# Patient Record
Sex: Female | Born: 1980 | Race: Black or African American | Hispanic: No | Marital: Single | Smoking: Former smoker
Health system: Southern US, Community
[De-identification: ages and names within clinical notes are randomized; demographics above are authoritative.]

## PROBLEM LIST (undated history)

## (undated) DIAGNOSIS — A64 Unspecified sexually transmitted disease: Secondary | ICD-10-CM

## (undated) DIAGNOSIS — K219 Gastro-esophageal reflux disease without esophagitis: Secondary | ICD-10-CM

## (undated) DIAGNOSIS — M1990 Unspecified osteoarthritis, unspecified site: Secondary | ICD-10-CM

## (undated) DIAGNOSIS — G43909 Migraine, unspecified, not intractable, without status migrainosus: Secondary | ICD-10-CM

## (undated) DIAGNOSIS — N920 Excessive and frequent menstruation with regular cycle: Secondary | ICD-10-CM

## (undated) DIAGNOSIS — J189 Pneumonia, unspecified organism: Secondary | ICD-10-CM

## (undated) DIAGNOSIS — F419 Anxiety disorder, unspecified: Secondary | ICD-10-CM

## (undated) DIAGNOSIS — I10 Essential (primary) hypertension: Secondary | ICD-10-CM

## (undated) DIAGNOSIS — F329 Major depressive disorder, single episode, unspecified: Secondary | ICD-10-CM

## (undated) DIAGNOSIS — Z789 Other specified health status: Secondary | ICD-10-CM

## (undated) DIAGNOSIS — T7840XA Allergy, unspecified, initial encounter: Secondary | ICD-10-CM

## (undated) DIAGNOSIS — D649 Anemia, unspecified: Secondary | ICD-10-CM

## (undated) DIAGNOSIS — T8859XA Other complications of anesthesia, initial encounter: Secondary | ICD-10-CM

## (undated) DIAGNOSIS — F32A Depression, unspecified: Secondary | ICD-10-CM

## (undated) DIAGNOSIS — E282 Polycystic ovarian syndrome: Secondary | ICD-10-CM

## (undated) HISTORY — PX: WISDOM TOOTH EXTRACTION: SHX21

## (undated) HISTORY — PX: LAPAROSCOPIC GASTRIC SLEEVE RESECTION: SHX5895

## (undated) HISTORY — DX: Unspecified sexually transmitted disease: A64

## (undated) HISTORY — PX: LUMBAR PUNCTURE: SHX1985

## (undated) HISTORY — DX: Allergy, unspecified, initial encounter: T78.40XA

## (undated) HISTORY — DX: Polycystic ovarian syndrome: E28.2

## (undated) HISTORY — PX: OTHER SURGICAL HISTORY: SHX169

## (undated) HISTORY — DX: Anemia, unspecified: D64.9

## (undated) HISTORY — DX: Migraine, unspecified, not intractable, without status migrainosus: G43.909

## (undated) HISTORY — PX: TONSILLECTOMY: SUR1361

---
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
## 2008-08-08 ENCOUNTER — Inpatient Hospital Stay (HOSPITAL_COMMUNITY): Admission: AD | Admit: 2008-08-08 | Discharge: 2008-08-08 | Payer: Self-pay | Admitting: Obstetrics & Gynecology

## 2008-10-09 ENCOUNTER — Inpatient Hospital Stay (HOSPITAL_COMMUNITY): Admission: AD | Admit: 2008-10-09 | Discharge: 2008-10-09 | Payer: Self-pay | Admitting: Obstetrics & Gynecology

## 2008-11-04 ENCOUNTER — Ambulatory Visit: Payer: Self-pay | Admitting: Obstetrics & Gynecology

## 2008-11-04 ENCOUNTER — Encounter: Payer: Self-pay | Admitting: Obstetrics & Gynecology

## 2008-11-04 LAB — CONVERTED CEMR LAB
TSH: 0.836 microintl units/mL (ref 0.350–4.50)
Testosterone: 44.98 ng/dL (ref 10–70)
hCG, Beta Chain, Quant, S: <2 milliintl units/mL

## 2008-11-15 ENCOUNTER — Ambulatory Visit (HOSPITAL_COMMUNITY): Admission: RE | Admit: 2008-11-15 | Discharge: 2008-11-15 | Payer: Self-pay | Admitting: Family Medicine

## 2008-11-25 ENCOUNTER — Ambulatory Visit: Payer: Self-pay | Admitting: Obstetrics & Gynecology

## 2011-05-01 NOTE — Group Therapy Note (Signed)
Jamie Chung, Jamie Chung              ACCOUNT NO.:  [id number]   MEDICAL RECORD NO.:  [id number]          PATIENT TYPE:  WOC   LOCATION:  [facility]                   FACILITY:  [facility]   PHYSICIAN:  Johnella Moloney, MD        DATE OF BIRTH:  Dec 30, 1980   DATE OF SERVICE:  11/04/2008                                  CLINIC NOTE   CHIEF COMPLAINT:  Irregular periods since 2 miscarriages; one in October  2008 and one in April 2009.   HISTORY OF PRESENT ILLNESS:  The patient states that she had a  miscarriage in October 2008 at approximately 10 weeks and a miscarriage  in April 2009 at 8 weeks at [facility].  Since that time, she has  had an irregular cycle, only menstruating in June and October of 2009.  Cycles during that time were normal in duration and flow.  Other months  she describes cramping during normal times, but no flow.  She also  describes some irregular vaginal discharge approximately 1-1/2 months  ago that cleared spontaneously, and again one week ago, which got better  with over the counter Monistat.  Since that time, she has only described  vaginal pain with intercourse.  No other pain, no burning or itching, no  urinary symptoms.  No other frank abdominal pain and no pain with bowel  movements.  She denies any abnormal Pap smears.   OB/GYN HISTORY:  This is a 30 year old female, gravida 2, para 0-0-2-0.  First day of the last menstrual cycle was October 10, 2008.  Menarche  began at age 89.  Irregular menstrual cycles, as stated above.  Cycles  usually last 5-6 days, medium flow, with mild pain with cycles with some  bleeding in between her cycles.   OBSTETRICAL HISTORY:  As stated above, gravida 2, para 0-0-2-0.  She  denies any cesarean sections.  Currently denies using any form of  contraception.  Date of last Pap smear is unknown.  Denies ever having  an abnormal Pap smear.   PAST MEDICAL HISTORY:  1. Seasonal allergies.  2. Asthma.   SOCIAL HISTORY:   Currently smokes 1/2 pack of cigarettes a day.  Denies  alcohol or drug abuse.   MEDICATIONS:  1. Claritin.  2. Tylenol.   ALLERGIES:  No known drug allergies.   PAST SURGICAL HISTORY:  None.   PHYSICAL EXAMINATION:  VITAL SIGNS:  Temperature 97.2, pulse 82 and  regular, blood pressure 117/75, weight 216 pounds, height 68-1/2 inches,  respirations 28 and regular.  GENERAL:  This is an obese African American female seated comfortably in  the room.  HEENT:  Head is normocephalic, atraumatic.  NECK:  Supple.  No masses.  No lymphadenopathy.  No thyromegaly.  BREASTS:  Breasts are bilateral and nontender.  No masses.  No  discharge.  No lymphadenopathy.  HEART:  S1, S2 distinct.  No murmurs, rubs, or gallops.  No S3 or S4.  LUNGS:  Good inspiratory effort.  Lungs are clear in all fields  bilaterally.  No wheezes, rhonchi, or crackles.  ABDOMEN:  Obese, soft, nontender.  No masses.  No  distention.  No  organomegaly.  PELVIC:  External genitalia are normal in appearance.  No lesions.  Normal hair distribution.  Internal genital exam is moist, pink, normal  rugae.  No lesions present.  Normal discharge from the os.  Pap was  performed today.   ASSESSMENT AND PLAN:  As stated above, this is a 30 year old female,  gravida 2, para 0-0-2-0.  Today we will check beta hCG, TSH, prolactin,  total testosterone, pelvic ultrasound.  Annual exam with Pap is pending.  The patient is to return to the clinic in 2 weeks for follow up and to  check results.           ______________________________  Johnella Moloney, MD     UD/MEDQ  D:  11/04/2008  T:  11/04/2008  Job:  [id number]

## 2014-12-08 ENCOUNTER — Inpatient Hospital Stay (HOSPITAL_COMMUNITY)
Admission: AD | Admit: 2014-12-08 | Discharge: 2014-12-08 | Disposition: A | Discharge status: Home / Self Care | Payer: 59 | Source: Ambulatory Visit | Attending: Obstetrics and Gynecology | Admitting: Obstetrics and Gynecology

## 2014-12-08 DIAGNOSIS — N939 Abnormal uterine and vaginal bleeding, unspecified: Secondary | ICD-10-CM | POA: Diagnosis present

## 2014-12-08 DIAGNOSIS — R109 Unspecified abdominal pain: Secondary | ICD-10-CM | POA: Diagnosis present

## 2014-12-08 DIAGNOSIS — N925 Other specified irregular menstruation: Secondary | ICD-10-CM | POA: Diagnosis not present

## 2014-12-08 LAB — CBC
HCT: 34.1 % — ABNORMAL LOW (ref 36.0–46.0)
HEMOGLOBIN: 11.6 g/dL — AB (ref 12.0–15.0)
MCH: 30.4 pg (ref 26.0–34.0)
MCHC: 34 g/dL (ref 30.0–36.0)
MCV: 89.5 fL (ref 78.0–100.0)
PLATELETS: 244 10*3/uL (ref 150–400)
RBC: 3.81 MIL/uL — AB (ref 3.87–5.11)
RDW: 13.2 % (ref 11.5–15.5)
WBC: 8.4 10*3/uL (ref 4.0–10.5)

## 2014-12-08 LAB — POCT PREGNANCY, URINE: Preg Test, Ur: NEGATIVE

## 2014-12-08 MED ORDER — MEGESTROL ACETATE 40 MG PO TABS: 40.0000 mg | ORAL_TABLET | Freq: Two times a day (BID) | ORAL | Status: DC

## 2014-12-08 MED ORDER — KETOROLAC TROMETHAMINE 60 MG/2ML IM SOLN
60.0000 mg | Freq: Once | INTRAMUSCULAR | Status: AC
  Administered 2014-12-08: 60 mg via INTRAMUSCULAR
  Filled 2014-12-08: qty 2

## 2014-12-08 MED ORDER — IBUPROFEN 800 MG PO TABS: 800.0000 mg | ORAL_TABLET | Freq: Three times a day (TID) | ORAL | Status: DC

## 2014-12-08 NOTE — MAU Note (Signed)
Patient states she has a history of PCOS with irregular periods. Sees a MD a [facility] in [place]. States she has had heavy bleeding and was put on medication to stop the bleeding for 10 days. Stopped the bleeding for 10 days . States she started bleeding heavy yesterday and has continued today. Saturated pads in less than 30 minutes. Really bad cramping. Patient has on double full size pads that have about 3/4 covered with blood, blood on panties and pants.

## 2014-12-08 NOTE — MAU Provider Note (Signed)
History     CSN: [id number]  Arrival date and time: 12/08/14 4098   First Provider Initiated Contact with Patient 12/08/14 1824      Chief Complaint  Patient presents with  . Vaginal Bleeding  . Abdominal Pain   HPI Comments: Jamie Chung 33 y.o. No obstetric history on file. Presents to MAU  Vaginal Bleeding Associated symptoms include abdominal pain. Pertinent negatives include no chills, diarrhea, dysuria, fever, frequency, hematuria, nausea, urgency or vomiting.  Abdominal Pain Pertinent negatives include no diarrhea, dysuria, fever, frequency, hematuria, nausea, vomiting or weight loss.  33 y.o. G2P0 presents with increased vaginal bleeding over last 2 days. Pt reports hx of irregular menses and given progesterone 10d rx approx 14d ago which resolved heavy menses. Pt reports bleeding after completing progesterone, worse over last 2 days with bil lower cramping pelvic pain 10/10. Pain is not relieved by 800mg  ibuprofen. Pt denies n/v, fever/chills, dysuria, hematuria, constipation, diarrhea, concern for STDs.  Pt reports has OBGYN in [facility]. Pt reports abnormal US showing uterine "mass or fibroid" 1 month ago, scheduled for biopsy 12/30. Pt reports referred to ER by OBGYN today over phone when called regarding bleeding. Pt reports "we want to try and get pregnant sometime next year", reports previously counseled by OB regarding OCP for regulating menses.  Pertinent Gynecological History: Menses: irregular occurring approximately every 10 days with spotting approximately 15 days per month Bleeding: intermenstrual bleeding and dysfunctional uterine bleeding Contraception: none DES exposure: denies Blood transfusions: none Sexually transmitted diseases: no past history Previous GYN Procedures: none  Last mammogram: N/A Date: N/A Last pap: normal Date:06/2014   No past medical history on file.  No past surgical history on file.  No family history on  file.  History  Substance Use Topics  . Smoking status: Not on file  . Smokeless tobacco: Not on file  . Alcohol Use: Not on file    Allergies:  Allergies  Allergen Reactions  . Mobic [Meloxicam] Swelling    Prescriptions prior to admission  Medication Sig Dispense Refill Last Dose  . ibuprofen (ADVIL,MOTRIN) 200 MG tablet Take 800 mg by mouth every 6 (six) hours as needed for moderate pain.   12/08/2014 at Unknown time    Review of Systems  Constitutional: Negative for fever, chills, weight loss and malaise/fatigue.  Respiratory: Negative for cough and shortness of breath.   Cardiovascular: Negative for chest pain.  Gastrointestinal: Positive for abdominal pain. Negative for heartburn, nausea, vomiting and diarrhea.  Genitourinary: Positive for vaginal bleeding. Negative for dysuria, urgency, frequency and hematuria.  Neurological: Negative for dizziness and weakness.   Physical Exam   Blood pressure 146/84, pulse 104, temperature 98.5 F (36.9 C), temperature source Oral, resp. rate 20.  Physical Exam  Constitutional: She appears well-developed and well-nourished.  Cardiovascular: Normal rate and normal heart sounds.   Respiratory: Effort normal and breath sounds normal. No respiratory distress.  GI: Soft. Bowel sounds are normal. She exhibits no distension and no mass. There is tenderness. There is no rebound and no guarding.  bil lower quad into pelvic pain with palpation, no masses or organomegaly, no CVA tenderness  Genitourinary: Vagina normal.  Small amount of vaginal bleeding, no clots visible. Cervix visualized, no inflammation or exudate. No CMT, no palpable uterine masses or fibroids.  Skin: Skin is warm and dry.  Psychiatric: She has a normal mood and affect. Her behavior is normal. Judgment and thought content normal.    MAU Course  Procedures  MDM CBC eval anemia, WNL. Pt requests stronger narcotic rx and work note for 12/23 and 12/24. 60mg  Toradol IM.   Discussed meds to control bleeding and side effects, pt initially declined and now requests Megace+. Results for orders placed or performed during the hospital encounter of 12/08/14 (from the past 24 hour(s))  CBC     Status: Abnormal   Collection Time: 12/08/14  6:24 PM  Result Value Ref Range   WBC 8.4 4.0 - 10.5 K/uL   RBC 3.81 (L) 3.87 - 5.11 MIL/uL   Hemoglobin 11.6 (L) 12.0 - 15.0 g/dL   HCT 34.1 (L) 36.0 - 46.0 %   MCV 89.5 78.0 - 100.0 fL   MCH 30.4 26.0 - 34.0 pg   MCHC 34.0 30.0 - 36.0 g/dL   RDW 13.2 11.5 - 15.5 %   Platelets 244 150 - 400 K/uL  Pregnancy, urine POC     Status: None   Collection Time: 12/08/14  6:55 PM  Result Value Ref Range   Preg Test, Ur NEGATIVE NEGATIVE    Assessment and Plan  A: Irregular Menstrual Bleeding  P: Discharge home Toradol 60 mg IM now Megace 40mg  PO BID x 10d for heavy vaginal bleeding Continue 800mg  Ibuprofen PO q8h PRN pain prescribed by outside OBGYN F/U on 12/30 for biopsy with OBGYN in [facility] If condition worsens, f/u with primary OBGYN Work note provided to patient.  [name], FNP-S  Jamille, Fisher 12/08/2014, 7:08 PM

## 2014-12-08 NOTE — Discharge Instructions (Signed)

## 2015-09-23 ENCOUNTER — Ambulatory Visit (INDEPENDENT_AMBULATORY_CARE_PROVIDER_SITE_OTHER): Payer: 59 | Admitting: Emergency Medicine

## 2015-09-23 VITALS — BP 120/68 | HR 87 | Temp 98.3°F | Resp 14 | Ht 68.0 in | Wt 203.2 lb

## 2015-09-23 DIAGNOSIS — R81 Glycosuria: Secondary | ICD-10-CM | POA: Diagnosis not present

## 2015-09-23 DIAGNOSIS — N3000 Acute cystitis without hematuria: Secondary | ICD-10-CM | POA: Diagnosis not present

## 2015-09-23 LAB — POCT URINALYSIS DIP (MANUAL ENTRY)
BILIRUBIN UA: NEGATIVE
Bilirubin, UA: NEGATIVE
Glucose, UA: 100 — AB
Nitrite, UA: POSITIVE — AB
PH UA: 6.5
SPEC GRAV UA: 1.02
Urobilinogen, UA: 1

## 2015-09-23 LAB — POC MICROSCOPIC URINALYSIS (UMFC)

## 2015-09-23 LAB — HEMOGLOBIN A1C: Hgb A1c MFr Bld: 6 % (ref 4.0–6.0)

## 2015-09-23 LAB — GLUCOSE, POCT (MANUAL RESULT ENTRY): POC GLUCOSE: 107 mg/dL — AB (ref 70–99)

## 2015-09-23 LAB — POCT GLYCOSYLATED HEMOGLOBIN (HGB A1C): HEMOGLOBIN A1C: 6

## 2015-09-23 MED ORDER — SULFAMETHOXAZOLE-TRIMETHOPRIM 800-160 MG PO TABS: 1.0000 | ORAL_TABLET | Freq: Two times a day (BID) | ORAL | Status: DC

## 2015-09-23 MED ORDER — PHENAZOPYRIDINE HCL 200 MG PO TABS: 200.0000 mg | ORAL_TABLET | Freq: Three times a day (TID) | ORAL | Status: DC | PRN

## 2015-09-23 NOTE — Progress Notes (Signed)
Subjective:  Patient ID: Jamie Chung, female    DOB: [birth year]  Age: 34 y.o. MRN: [id number]  CC: Dysuria and Urinary Frequency   HPI Jamie Chung presents  with dysuria urgency and frequency. She has been taking AZO for the discomfort. She denies any fever chills nausea vomiting. No back pain. No vaginal discharge or bleeding. No dyspareunia. She is under treatment for polycystic ovarian syndrome. Has regular periods.  History [name] has a past medical history of Allergy.   She has no past surgical history on file.   Her  family history is not on file.  She   reports that she has been smoking.  She does not have any smokeless tobacco history on file. Her alcohol and drug histories are not on file.  Outpatient Prescriptions Prior to Visit  Medication Sig Dispense Refill  . ibuprofen (ADVIL,MOTRIN) 800 MG tablet Take 1 tablet (800 mg total) by mouth 3 (three) times daily. (Patient not taking: Reported on 09/23/2015) 60 tablet 0  . megestrol (MEGACE) 40 MG tablet Take 1 tablet (40 mg total) by mouth 2 (two) times daily. (Patient not taking: Reported on 09/23/2015) 20 tablet 0   No facility-administered medications prior to visit.    Social History   Social History  . Marital Status: Single    Spouse Name: N/A  . Number of Children: N/A  . Years of Education: N/A   Social History Main Topics  . Smoking status: Current Every Day Smoker  . Smokeless tobacco: None  . Alcohol Use: None  . Drug Use: None  . Sexual Activity: Not Asked   Other Topics Concern  . None   Social History Narrative  . None     Review of Systems  Constitutional: Negative for fever, chills and appetite change.  HENT: Negative for congestion, ear pain, postnasal drip, sinus pressure and sore throat.   Eyes: Negative for pain and redness.  Respiratory: Negative for cough, shortness of breath and wheezing.   Cardiovascular: Negative for leg swelling.  Gastrointestinal: Negative for  nausea, vomiting, abdominal pain, diarrhea, constipation and blood in stool.  Endocrine: Negative for polyuria.  Genitourinary: Positive for dysuria, urgency and frequency. Negative for flank pain.  Musculoskeletal: Negative for gait problem.  Skin: Negative for rash.  Neurological: Negative for weakness and headaches.  Psychiatric/Behavioral: Negative for confusion and decreased concentration. The patient is not nervous/anxious.     Objective:  BP 120/68 mmHg  Pulse 87  Temp(Src) 98.3 F (36.8 C) (Oral)  Resp 14  Ht 5\' 8"  (1.727 m)  Wt 203 lb 3.2 oz (92.171 kg)  BMI 30.90 kg/m2  SpO2 98%  LMP 09/19/2015  Physical Exam  Constitutional: She is oriented to person, place, and time. She appears well-developed and well-nourished. No distress.  HENT:  Head: Normocephalic and atraumatic.  Right Ear: External ear normal.  Left Ear: External ear normal.  Nose: Nose normal.  Eyes: Conjunctivae and EOM are normal. Pupils are equal, round, and reactive to light. No scleral icterus.  Neck: Normal range of motion. Neck supple. No tracheal deviation present.  Cardiovascular: Normal rate, regular rhythm and normal heart sounds.   Pulmonary/Chest: Effort normal. No respiratory distress. She has no wheezes. She has no rales.  Abdominal: She exhibits no mass. There is no tenderness. There is no rebound and no guarding.  Musculoskeletal: She exhibits no edema.  Lymphadenopathy:    She has no cervical adenopathy.  Neurological: She is alert and oriented to person, place, and  time. Coordination normal.  Skin: Skin is warm and dry. No rash noted.  Psychiatric: She has a normal mood and affect. Her behavior is normal.      Assessment & Plan:   Glenis was seen today for dysuria and urinary frequency.  Diagnoses and all orders for this visit:  Acute cystitis without hematuria -     POCT urinalysis dipstick -     POCT Microscopic Urinalysis ([facility])  Glucosuria -     POCT glucose (manual  entry) -     POCT glycosylated hemoglobin (Hb A1C)  Other orders -     sulfamethoxazole-trimethoprim (BACTRIM DS,SEPTRA DS) 800-160 MG tablet; Take 1 tablet by mouth 2 (two) times daily. -     phenazopyridine (PYRIDIUM) 200 MG tablet; Take 1 tablet (200 mg total) by mouth 3 (three) times daily as needed.  I am having Ms. Kiesel start on sulfamethoxazole-trimethoprim and phenazopyridine. I am also having her maintain her megestrol, ibuprofen, and Phenazopyridine HCl (AZO TABS PO).  Meds ordered this encounter  Medications  . Phenazopyridine HCl (AZO TABS PO)    Sig: Take by mouth.  . sulfamethoxazole-trimethoprim (BACTRIM DS,SEPTRA DS) 800-160 MG tablet    Sig: Take 1 tablet by mouth 2 (two) times daily.    Dispense:  20 tablet    Refill:  0  . phenazopyridine (PYRIDIUM) 200 MG tablet    Sig: Take 1 tablet (200 mg total) by mouth 3 (three) times daily as needed.    Dispense:  6 tablet    Refill:  0    Appropriate red flag conditions were discussed with the patient as well as actions that should be taken.  Patient expressed his understanding.  Follow-up: Return if symptoms worsen or fail to improve.  Roselee Culver, MD   Results for orders placed or performed in visit on 09/23/15  POCT urinalysis dipstick  Result Value Ref Range   Color, UA orange (A) yellow   Clarity, UA cloudy (A) clear   Glucose, UA =100 (A) negative   Bilirubin, UA negative negative   Ketones, POC UA negative negative   Spec Grav, UA 1.020    Blood, UA small (A) negative   pH, UA 6.5    Protein Ur, POC trace (A) negative   Urobilinogen, UA 1.0    Nitrite, UA Positive (A) Negative   Leukocytes, UA Trace (A) Negative  POCT Microscopic Urinalysis ([facility])  Result Value Ref Range   WBC,UR,HPF,POC Moderate (A) None WBC/hpf   RBC,UR,HPF,POC Moderate (A) None RBC/hpf   Bacteria Many (A) None   Mucus Present (A) Absent   Epithelial Cells, UR Per Microscopy Many (A) None cells/hpf   Renal tubular cells  present   POCT glucose (manual entry)  Result Value Ref Range   POC Glucose 107 (A) 70 - 99 mg/dl  POCT glycosylated hemoglobin (Hb A1C)  Result Value Ref Range   Hemoglobin A1C 6.0

## 2015-09-23 NOTE — Patient Instructions (Signed)

## 2015-10-17 ENCOUNTER — Encounter: Payer: Self-pay | Admitting: Family Medicine

## 2016-05-22 ENCOUNTER — Encounter (HOSPITAL_COMMUNITY): Payer: Self-pay | Admitting: *Deleted

## 2016-05-22 ENCOUNTER — Inpatient Hospital Stay (HOSPITAL_COMMUNITY)
Admission: AD | Admit: 2016-05-22 | Discharge: 2016-05-22 | Disposition: A | Discharge status: Home / Self Care | Payer: 59 | Source: Ambulatory Visit | Attending: Obstetrics and Gynecology | Admitting: Obstetrics and Gynecology

## 2016-05-22 DIAGNOSIS — R3915 Urgency of urination: Secondary | ICD-10-CM | POA: Insufficient documentation

## 2016-05-22 DIAGNOSIS — F17210 Nicotine dependence, cigarettes, uncomplicated: Secondary | ICD-10-CM | POA: Diagnosis not present

## 2016-05-22 DIAGNOSIS — A084 Viral intestinal infection, unspecified: Secondary | ICD-10-CM | POA: Diagnosis not present

## 2016-05-22 DIAGNOSIS — R300 Dysuria: Secondary | ICD-10-CM | POA: Insufficient documentation

## 2016-05-22 HISTORY — DX: Other specified health status: Z78.9

## 2016-05-22 LAB — CBC WITH DIFFERENTIAL/PLATELET
Basophils Absolute: 0 10*3/uL (ref 0.0–0.1)
Basophils Relative: 0 %
Eosinophils Absolute: 0 10*3/uL (ref 0.0–0.7)
Eosinophils Relative: 0 %
HEMATOCRIT: 32.2 % — AB (ref 36.0–46.0)
HEMOGLOBIN: 10.6 g/dL — AB (ref 12.0–15.0)
LYMPHS ABS: 1.1 10*3/uL (ref 0.7–4.0)
LYMPHS PCT: 18 %
MCH: 25.2 pg — AB (ref 26.0–34.0)
MCHC: 32.9 g/dL (ref 30.0–36.0)
MCV: 76.5 fL — AB (ref 78.0–100.0)
Monocytes Absolute: 0.2 10*3/uL (ref 0.1–1.0)
Monocytes Relative: 4 %
NEUTROS ABS: 4.5 10*3/uL (ref 1.7–7.7)
NEUTROS PCT: 78 %
Platelets: 241 10*3/uL (ref 150–400)
RBC: 4.21 MIL/uL (ref 3.87–5.11)
RDW: 20.4 % — ABNORMAL HIGH (ref 11.5–15.5)
WBC: 5.8 10*3/uL (ref 4.0–10.5)

## 2016-05-22 LAB — URINALYSIS, ROUTINE W REFLEX MICROSCOPIC
Bilirubin Urine: NEGATIVE
Glucose, UA: NEGATIVE mg/dL
Hgb urine dipstick: NEGATIVE
Ketones, ur: NEGATIVE mg/dL
Leukocytes, UA: NEGATIVE
NITRITE: NEGATIVE
PH: 6.5 (ref 5.0–8.0)
Protein, ur: NEGATIVE mg/dL

## 2016-05-22 LAB — POCT PREGNANCY, URINE: Preg Test, Ur: NEGATIVE

## 2016-05-22 MED ORDER — IBUPROFEN 800 MG PO TABS
800.0000 mg | ORAL_TABLET | Freq: Once | ORAL | Status: AC
  Administered 2016-05-22: 800 mg via ORAL
  Filled 2016-05-22: qty 1

## 2016-05-22 MED ORDER — ONDANSETRON HCL 4 MG PO TABS: 4.0000 mg | ORAL_TABLET | Freq: Four times a day (QID) | ORAL | Status: DC

## 2016-05-22 NOTE — MAU Provider Note (Signed)
History     CSN: [id number]  Arrival date and time: 05/22/16 0113   First Provider Initiated Contact with Patient 05/22/16 0149      Chief Complaint  Patient presents with  . Body Aches   . Painful Urination    HPI  [name] is a 35 y.o. female who presents to MAU today with complaint of dysuria and body aches. The patient states that she first noted dysuria a couple of weeks ago. She took AZO and it resolved. She states that it returned yesterday. She has also had N/V today and diarrhea this morning. She states associated generalized body aches today, but denies fever. She denies sick contacts. She has had lower abdominal pain and mid thoracic back pain. She tried [employer] without relief.   OB History    Gravida Para Term Preterm AB TAB SAB Ectopic Multiple Living   2 0   2  2   0      Past Medical History  Diagnosis Date  . Allergy   . Medical history non-contributory     Past Surgical History  Procedure Laterality Date  . Tonsillectomy      History reviewed. No pertinent family history.  Social History  Substance Use Topics  . Smoking status: Current Every Day Smoker -- 0.25 packs/day for 4 years    Types: Cigarettes  . Smokeless tobacco: Current User  . Alcohol Use: No    Allergies:  Allergies  Allergen Reactions  . Mobic [Meloxicam] Swelling    Prescriptions prior to admission  Medication Sig Dispense Refill Last Dose  . ibuprofen (ADVIL,MOTRIN) 800 MG tablet Take 1 tablet (800 mg total) by mouth 3 (three) times daily. (Patient not taking: Reported on 09/23/2015) 60 tablet 0 Not Taking  . megestrol (MEGACE) 40 MG tablet Take 1 tablet (40 mg total) by mouth 2 (two) times daily. (Patient not taking: Reported on 09/23/2015) 20 tablet 0 Not Taking  . phenazopyridine (PYRIDIUM) 200 MG tablet Take 1 tablet (200 mg total) by mouth 3 (three) times daily as needed. 6 tablet 0   . Phenazopyridine HCl (AZO TABS PO) Take by mouth.   Taking  .  sulfamethoxazole-trimethoprim (BACTRIM DS,SEPTRA DS) 800-160 MG tablet Take 1 tablet by mouth 2 (two) times daily. 20 tablet 0     Review of Systems  Constitutional: Negative for fever and malaise/fatigue.  Gastrointestinal: Positive for nausea, vomiting, abdominal pain and diarrhea. Negative for constipation.  Genitourinary: Positive for dysuria, urgency and flank pain. Negative for frequency and hematuria.       Neg - vaginal bleeding, discharge   Physical Exam   Blood pressure 116/64, pulse 101, temperature 98.6 F (37 C), temperature source Oral, resp. rate 20, height 5\' 8"  (1.727 m), weight 211 lb 12 oz (96.049 kg), last menstrual period 05/11/2016.  Physical Exam  Nursing note and vitals reviewed. Constitutional: She is oriented to person, place, and time. She appears well-developed and well-nourished. No distress.  HENT:  Head: Normocephalic and atraumatic.  Cardiovascular: Normal rate.   Respiratory: Effort normal.  GI: Soft. She exhibits no distension and no mass. There is no tenderness. There is no rebound, no guarding and no CVA tenderness.  Neurological: She is alert and oriented to person, place, and time.  Skin: Skin is warm and dry. No erythema.  Psychiatric: She has a normal mood and affect.    Results for orders placed or performed during the hospital encounter of 05/22/16 (from the past 24  hour(s))  Pregnancy, urine POC     Status: None   Collection Time: 05/22/16  1:59 AM  Result Value Ref Range   Preg Test, Ur NEGATIVE NEGATIVE  Urinalysis, Routine w reflex microscopic (not at [facility])     Status: Abnormal   Collection Time: 05/22/16  2:10 AM  Result Value Ref Range   Color, Urine YELLOW YELLOW   APPearance CLEAR CLEAR   Specific Gravity, Urine <1.005 (L) 1.005 - 1.030   pH 6.5 5.0 - 8.0   Glucose, UA NEGATIVE NEGATIVE mg/dL   Hgb urine dipstick NEGATIVE NEGATIVE   Bilirubin Urine NEGATIVE NEGATIVE   Ketones, ur NEGATIVE NEGATIVE mg/dL   Protein, ur  NEGATIVE NEGATIVE mg/dL   Nitrite NEGATIVE NEGATIVE   Leukocytes, UA NEGATIVE NEGATIVE  CBC with Differential/Platelet     Status: Abnormal (Preliminary result)   Collection Time: 05/22/16  3:07 AM  Result Value Ref Range   WBC 5.8 4.0 - 10.5 K/uL   RBC 4.21 3.87 - 5.11 MIL/uL   Hemoglobin 10.6 (L) 12.0 - 15.0 g/dL   HCT 32.2 (L) 36.0 - 46.0 %   MCV 76.5 (L) 78.0 - 100.0 fL   MCH 25.2 (L) 26.0 - 34.0 pg   MCHC 32.9 30.0 - 36.0 g/dL   RDW 20.4 (H) 11.5 - 15.5 %   Platelets 241 150 - 400 K/uL   Neutrophils Relative % 78 %   Neutro Abs 4.5 1.7 - 7.7 K/uL   Lymphocytes Relative 18 %   Lymphs Abs 1.1 0.7 - 4.0 K/uL   Monocytes Relative 4 %   Monocytes Absolute 0.2 0.1 - 1.0 K/uL   Eosinophils Relative 0 %   Eosinophils Absolute 0.0 0.0 - 0.7 K/uL   Basophils Relative 0 %   Basophils Absolute 0.0 0.0 - 0.1 K/uL   Other PENDING %    MAU Course  Procedures None  MDM UPT  UA today  800 mg Ibuprofen given for low grade fever and body aches. Temperature has improved to normal. Patient still having 8/10 generalized body aches.  Discussed patient with Dr. Matthew Saras. Urine culture, CBC. Patient may be discharged if CBC is without concerning values to continue Motrin PRN for pain and follow-up in the office.   Assessment and Plan  A: Viral gastroenteritis Dysuria   P: Discharge home Rx for Zofran given to patient  Advised Motrin PRN for pain/fever Warning signs for worsening condition discussed Urine culture pending Discussed appropriate hydration and diet for gastroenteritis included on AVS Patient advised to follow-up with [facility] as needed if symptoms persist or worsen Patient may return to MAU as needed or if her condition were to change or worsen   Luvenia Redden, PA-C  05/22/2016, 3:19 AM

## 2016-05-22 NOTE — MAU Note (Signed)
Patient presents stating she is not pregnant with c/o body aches and painful urination X 2 weeks. Denies bleeding or discharge.

## 2016-05-22 NOTE — Discharge Instructions (Signed)
Viral Gastroenteritis Viral gastroenteritis is also called stomach flu. This illness is caused by a certain type of germ (virus). It can cause sudden watery poop (diarrhea) and throwing up (vomiting). This can cause you to lose body fluids (dehydration). This illness usually lasts for 3 to 8 days. It usually goes away on its own. HOME CARE   Drink enough fluids to keep your pee (urine) clear or pale yellow. Drink small amounts of fluids often.  Ask your doctor how to replace body fluid losses (rehydration).  Avoid:  Foods high in sugar.  Alcohol.  Bubbly (carbonated) drinks.  Tobacco.  Juice.  Caffeine drinks.  Very hot or cold fluids.  Fatty, greasy foods.  Eating too much at one time.  Dairy products until 24 to 48 hours after your watery poop stops.  You may eat foods with active cultures (probiotics). They can be found in some yogurts and supplements.  Wash your hands well to avoid spreading the illness.  Only take medicines as told by your doctor. Do not give aspirin to children. Do not take medicines for watery poop (antidiarrheals).  Ask your doctor if you should keep taking your regular medicines.  Keep all doctor visits as told. GET HELP RIGHT AWAY IF:   You cannot keep fluids down.  You do not pee at least once every 6 to 8 hours.  You are short of breath.  You see blood in your poop or throw up. This may look like coffee grounds.  You have belly (abdominal) pain that gets worse or is just in one small spot (localized).  You keep throwing up or having watery poop.  You have a fever.  The patient is a child younger than 3 months, and he or she has a fever.  The patient is a child older than 3 months, and he or she has a fever and problems that do not go away.  The patient is a child older than 3 months, and he or she has a fever and problems that suddenly get worse.  The patient is a baby, and he or she has no tears when crying. MAKE SURE YOU:     Understand these instructions.  Will watch your condition.  Will get help right away if you are not doing well or get worse.   This information is not intended to replace advice given to you by your health care provider. Make sure you discuss any questions you have with your health care provider.   Document Released: 05/21/2008 Document Revised: 02/25/2012 Document Reviewed: 09/19/2011 Elsevier Interactive Patient Education 2016 [place] Choices to Help Relieve Diarrhea, Adult When you have diarrhea, the foods you eat and your eating habits are very important. Choosing the right foods and drinks can help relieve diarrhea. Also, because diarrhea can last up to 7 days, you need to replace lost fluids and electrolytes (such as sodium, potassium, and chloride) in order to help prevent dehydration.  WHAT GENERAL GUIDELINES DO I NEED TO FOLLOW?  Slowly drink 1 cup (8 oz) of fluid for each episode of diarrhea. If you are getting enough fluid, your urine will be clear or pale yellow.  Eat starchy foods. Some good choices include white rice, white toast, pasta, low-fiber cereal, baked potatoes (without the skin), saltine crackers, and bagels.  Avoid large servings of any cooked vegetables.  Limit fruit to two servings per day. A serving is  cup or 1 small piece.  Choose foods with less than 2  g of fiber per serving.  Limit fats to less than 8 tsp (38 g) per day.  Avoid fried foods.  Eat foods that have probiotics in them. Probiotics can be found in certain dairy products.  Avoid foods and beverages that may increase the speed at which food moves through the stomach and intestines (gastrointestinal tract). Things to avoid include:  High-fiber foods, such as dried fruit, raw fruits and vegetables, nuts, seeds, and whole grain foods.  Spicy foods and high-fat foods.  Foods and beverages sweetened with high-fructose corn syrup, honey, or sugar alcohols such as xylitol,  sorbitol, and mannitol. WHAT FOODS ARE RECOMMENDED? Grains White rice. White, [place], or pita breads (fresh or toasted), including plain rolls, buns, or bagels. White pasta. Saltine, soda, or graham crackers. Pretzels. Low-fiber cereal. Cooked cereals made with water (such as cornmeal, farina, or cream cereals). Plain muffins. Matzo. Melba toast. Zwieback.  Vegetables Potatoes (without the skin). Strained tomato and vegetable juices. Most well-cooked and canned vegetables without seeds. Tender lettuce. Fruits Cooked or canned applesauce, apricots, cherries, fruit cocktail, grapefruit, peaches, pears, or plums. Fresh bananas, apples without skin, cherries, grapes, cantaloupe, grapefruit, peaches, oranges, or plums.  Meat and Other Protein Products Baked or boiled chicken. Eggs. Tofu. Fish. Seafood. Smooth peanut butter. Ground or well-cooked tender beef, ham, veal, lamb, pork, or poultry.  Dairy Plain yogurt, kefir, and unsweetened liquid yogurt. Lactose-free milk, buttermilk, or soy milk. Plain hard cheese. Beverages Sport drinks. Clear broths. Diluted fruit juices (except prune). Regular, caffeine-free sodas such as ginger ale. Water. Decaffeinated teas. Oral rehydration solutions. Sugar-free beverages not sweetened with sugar alcohols. Other Bouillon, broth, or soups made from recommended foods.  The items listed above may not be a complete list of recommended foods or beverages. Contact your dietitian for more options. WHAT FOODS ARE NOT RECOMMENDED? Grains Whole grain, whole wheat, bran, or rye breads, rolls, pastas, crackers, and cereals. Wild or brown rice. Cereals that contain more than 2 g of fiber per serving. Corn tortillas or taco shells. Cooked or dry oatmeal. Granola. Popcorn. Vegetables Raw vegetables. Cabbage, broccoli, Brussels sprouts, artichokes, baked beans, beet greens, corn, kale, legumes, peas, sweet potatoes, and yams. Potato skins. Cooked spinach and  cabbage. Fruits Dried fruit, including raisins and dates. Raw fruits. Stewed or dried prunes. Fresh apples with skin, apricots, mangoes, pears, raspberries, and strawberries.  Meat and Other Protein Products Chunky peanut butter. Nuts and seeds. Beans and lentils. Berniece Salines.  Dairy High-fat cheeses. Milk, chocolate milk, and beverages made with milk, such as milk shakes. Cream. Ice cream. Sweets and Desserts Sweet rolls, doughnuts, and sweet breads. Pancakes and waffles. Fats and Oils Butter. Cream sauces. Margarine. Salad oils. Plain salad dressings. Olives. Avocados.  Beverages Caffeinated beverages (such as coffee, tea, soda, or energy drinks). Alcoholic beverages. Fruit juices with pulp. Prune juice. Soft drinks sweetened with high-fructose corn syrup or sugar alcohols. Other Coconut. Hot sauce. Chili powder. Mayonnaise. Gravy. Cream-based or milk-based soups.  The items listed above may not be a complete list of foods and beverages to avoid. Contact your dietitian for more information. WHAT SHOULD I DO IF I BECOME DEHYDRATED? Diarrhea can sometimes lead to dehydration. Signs of dehydration include dark urine and dry mouth and skin. If you think you are dehydrated, you should rehydrate with an oral rehydration solution. These solutions can be purchased at pharmacies, retail stores, or online.  Drink -1 cup (120-240 mL) of oral rehydration solution each time you have an episode of diarrhea. If drinking this  amount makes your diarrhea worse, try drinking smaller amounts more often. For example, drink 1-3 tsp (5-15 mL) every 5-10 minutes.  A general rule for staying hydrated is to drink 1-2 L of fluid per day. Talk to your health care provider about the specific amount you should be drinking each day. Drink enough fluids to keep your urine clear or pale yellow.   This information is not intended to replace advice given to you by your health care provider. Make sure you discuss any questions you  have with your health care provider.   Document Released: 02/23/2004 Document Revised: 12/24/2014 Document Reviewed: 10/26/2013 Elsevier Interactive Patient Education [employer].

## 2016-05-23 LAB — URINE CULTURE

## 2017-06-01 ENCOUNTER — Encounter (HOSPITAL_COMMUNITY): Payer: Self-pay | Admitting: Emergency Medicine

## 2017-06-01 ENCOUNTER — Emergency Department (HOSPITAL_COMMUNITY)
Admission: EM | Admit: 2017-06-01 | Discharge: 2017-06-01 | Disposition: A | Discharge status: Home / Self Care | Payer: 59 | Attending: Emergency Medicine | Admitting: Emergency Medicine

## 2017-06-01 DIAGNOSIS — W228XXA Striking against or struck by other objects, initial encounter: Secondary | ICD-10-CM | POA: Insufficient documentation

## 2017-06-01 DIAGNOSIS — Y999 Unspecified external cause status: Secondary | ICD-10-CM | POA: Insufficient documentation

## 2017-06-01 DIAGNOSIS — Y92513 Shop (commercial) as the place of occurrence of the external cause: Secondary | ICD-10-CM | POA: Insufficient documentation

## 2017-06-01 DIAGNOSIS — S4992XA Unspecified injury of left shoulder and upper arm, initial encounter: Secondary | ICD-10-CM | POA: Diagnosis present

## 2017-06-01 DIAGNOSIS — S40022A Contusion of left upper arm, initial encounter: Secondary | ICD-10-CM | POA: Insufficient documentation

## 2017-06-01 DIAGNOSIS — F17210 Nicotine dependence, cigarettes, uncomplicated: Secondary | ICD-10-CM | POA: Diagnosis not present

## 2017-06-01 DIAGNOSIS — Y9389 Activity, other specified: Secondary | ICD-10-CM | POA: Diagnosis not present

## 2017-06-01 NOTE — Discharge Instructions (Signed)
Apply ice to the affected area.  You can take Tylenol or Ibuprofen as directed for pain.   Follow-up with primary care doctor next 24-48 hours for further evaluation.  Return the emergency Department for any worsening pain, redness/swelling that extends down her arm, numbness/weakness of her arm or any other worsening or concerning symptoms.  If you do not have a primary care doctor you see regularly, please you the list below. Please call them to arrange for follow-up.    No Primary Care Doctor Call [place] Other agencies that provide inexpensive medical care    [facility]  [phone]    [facility]  [place]  [phone]    [facility]  [phone]    [facility]  [phone]    [facility]  [phone]
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
## 2017-12-07 ENCOUNTER — Ambulatory Visit (HOSPITAL_COMMUNITY)
Admission: EM | Admit: 2017-12-07 | Discharge: 2017-12-07 | Disposition: A | Discharge status: Home / Self Care | Payer: 59 | Attending: Family Medicine | Admitting: Family Medicine

## 2017-12-07 ENCOUNTER — Other Ambulatory Visit: Payer: Self-pay

## 2017-12-07 ENCOUNTER — Encounter (HOSPITAL_COMMUNITY): Payer: Self-pay

## 2017-12-07 DIAGNOSIS — Z886 Allergy status to analgesic agent status: Secondary | ICD-10-CM | POA: Diagnosis not present

## 2017-12-07 DIAGNOSIS — L298 Other pruritus: Secondary | ICD-10-CM | POA: Insufficient documentation

## 2017-12-07 DIAGNOSIS — F17210 Nicotine dependence, cigarettes, uncomplicated: Secondary | ICD-10-CM | POA: Diagnosis not present

## 2017-12-07 DIAGNOSIS — I10 Essential (primary) hypertension: Secondary | ICD-10-CM | POA: Diagnosis not present

## 2017-12-07 DIAGNOSIS — Z79899 Other long term (current) drug therapy: Secondary | ICD-10-CM | POA: Insufficient documentation

## 2017-12-07 DIAGNOSIS — Z888 Allergy status to other drugs, medicaments and biological substances status: Secondary | ICD-10-CM | POA: Insufficient documentation

## 2017-12-07 DIAGNOSIS — N898 Other specified noninflammatory disorders of vagina: Secondary | ICD-10-CM | POA: Insufficient documentation

## 2017-12-07 HISTORY — DX: Essential (primary) hypertension: I10

## 2017-12-07 MED ORDER — FLUCONAZOLE 150 MG PO TABS: 150.0000 mg | ORAL_TABLET | Freq: Every day | ORAL | 0 refills | Status: DC

## 2017-12-07 MED ORDER — HYDROXYZINE HCL 25 MG PO TABS: 25.0000 mg | ORAL_TABLET | Freq: Four times a day (QID) | ORAL | 0 refills | Status: DC | PRN

## 2017-12-07 MED ORDER — CLOTRIMAZOLE 1 % EX CREA: TOPICAL_CREAM | CUTANEOUS | 0 refills | Status: DC

## 2017-12-07 NOTE — ED Triage Notes (Signed)
Pt presents today with vaginal itching x1 week. States there is no discharge or odor. Does have a burning sensation as well. Happened after she had sex but she states she used protection.

## 2017-12-07 NOTE — ED Provider Notes (Signed)
[facility]    CSN: [id number] Arrival date & time: 12/07/17  1853     History   Chief Complaint Chief Complaint  Patient presents with  . Vaginal Itching    HPI Jamie Chung is a 36 y.o. female.   Presents with a 1 week history of vaginal irritation and itching. Her discomfort is noted all around the vagina region. Some mild discharge without odor. She feels so irritated that when she urinates it "burns" the vagina. No dysuria, no fever or chills. No abdominal or pelvic pain. No frank concerns of STD's but wants to be tested. She carries a history of HTN, but has not taken her anti-hypertensives today.       Past Medical History:  Diagnosis Date  . Allergy   . Hypertension   . Medical history non-contributory     There are no active problems to display for this patient.   Past Surgical History:  Procedure Laterality Date  . TONSILLECTOMY      OB History    Gravida Para Term Preterm AB Living   2 0     2 0   SAB TAB Ectopic Multiple Live Births   2               Home Medications    Prior to Admission medications   Medication Sig Start Date End Date Taking? Authorizing Provider  hydrochlorothiazide (HYDRODIURIL) 50 MG tablet Take 50 mg by mouth daily.   Yes [provider]  losartan (COZAAR) 25 MG tablet Take 25 mg by mouth daily.   Yes [provider]  clotrimazole (LOTRIMIN) 1 % cream Apply to affected area 2 times daily 12/07/17   Bjorn Pippin, PA-C  fluconazole (DIFLUCAN) 150 MG tablet Take 1 tablet (150 mg total) by mouth daily. 12/07/17   Bjorn Pippin, PA-C  hydrOXYzine (ATARAX/VISTARIL) 25 MG tablet Take 1 tablet (25 mg total) by mouth every 6 (six) hours as needed for itching. 12/07/17   Bjorn Pippin, PA-C  ibuprofen (ADVIL,MOTRIN) 800 MG tablet Take 1 tablet (800 mg total) by mouth 3 (three) times daily. Patient not taking: Reported on 09/23/2015 12/08/14   Olegario Messier, NP  megestrol (MEGACE) 40  MG tablet Take 1 tablet (40 mg total) by mouth 2 (two) times daily. Patient not taking: Reported on 09/23/2015 12/08/14   Olegario Messier, NP  ondansetron (ZOFRAN) 4 MG tablet Take 1 tablet (4 mg total) by mouth every 6 (six) hours. 05/22/16   Luvenia Redden, PA-C  Phenazopyridine HCl (AZO TABS PO) Take by mouth.    [provider]    Family History No family history on file.  Social History Social History   Tobacco Use  . Smoking status: Current Some Day Smoker    Packs/day: 0.25    Years: 4.00    Pack years: 1.00    Types: Cigarettes  . Smokeless tobacco: Current User  . Tobacco comment: maybe once/week  Substance Use Topics  . Alcohol use: No    Alcohol/week: 0.0 oz  . Drug use: No     Allergies   Lisinopril and Mobic [meloxicam]   Review of Systems Review of Systems  All other systems reviewed and are negative.    Physical Exam Triage Vital Signs ED Triage Vitals  Enc Vitals Group     BP      Pulse      Resp      Temp  Temp src      SpO2      Weight      Height      Head Circumference      Peak Flow      Pain Score      Pain Loc      Pain Edu?      Excl. in [place]?    No data found.  Updated Vital Signs BP (!) 156/93 (BP Location: Left Arm)   Pulse 97   Temp 99 F (37.2 C) (Oral)   Resp 16   SpO2 97%   Visual Acuity Right Eye Distance:   Left Eye Distance:   Bilateral Distance:    Right Eye Near:   Left Eye Near:    Bilateral Near:     Physical Exam  Constitutional: She is oriented to person, place, and time. She appears well-developed and well-nourished. No distress.  Abdominal: Soft. Bowel sounds are normal.  Genitourinary:  Genitourinary Comments: Mild erythema under the vagina hood and along the labia minora, mild thin white discharge without odor  Neurological: She is alert and oriented to person, place, and time.  Skin: Skin is warm and dry. She is not diaphoretic.  Psychiatric: Her behavior is normal.  Nursing note  and vitals reviewed.    UC Treatments / Results  Labs (all labs ordered are listed, but only abnormal results are displayed) Labs Reviewed  CERVICOVAGINAL ANCILLARY ONLY    EKG  EKG Interpretation None       Radiology No results found.  Procedures Procedures (including critical care time)  Medications Ordered in UC Medications - No data to display   Initial Impression / Assessment and Plan / UC Course  I have reviewed the triage vital signs and the nursing notes.  Pertinent labs & imaging results that were available during my care of the patient were reviewed by me and considered in my medical decision making (see chart for details).     Clinically most consistent with candida vaginitis. Will treat both internal and external symtpoms, test for STD as well. FU if not improving.  FU with PCP for HTN  Final Clinical Impressions(s) / UC Diagnoses   Final diagnoses:  Vaginal itching  Vaginal discharge  Essential hypertension    ED Discharge Orders        Ordered    fluconazole (DIFLUCAN) 150 MG tablet  Daily     12/07/17 1929    clotrimazole (LOTRIMIN) 1 % cream     12/07/17 1929    hydrOXYzine (ATARAX/VISTARIL) 25 MG tablet  Every 6 hours PRN     12/07/17 1930       Controlled Substance Prescriptions [place] Controlled Substance Registry consulted? Not Applicable   Prudencio Pair 12/07/17 1942

## 2017-12-07 NOTE — Discharge Instructions (Signed)
Your irritation looks like yeast. Will treat with one tablet. The ointment you may use twice a day for up to 1-2 weeks. Keep area dry. The atarax is to be used for itching. It may cause drowsiness so be mindful of that. FU with PCP if not improving. Also be sure to take your BP medications today as directed. Happy holiday.

## 2017-12-10 LAB — CERVICOVAGINAL ANCILLARY ONLY
BACTERIAL VAGINITIS: NEGATIVE
Candida vaginitis: POSITIVE — AB
Chlamydia: NEGATIVE
Neisseria Gonorrhea: NEGATIVE
Trichomonas: POSITIVE — AB

## 2017-12-11 ENCOUNTER — Telehealth (HOSPITAL_COMMUNITY): Payer: Self-pay | Admitting: Internal Medicine

## 2017-12-11 MED ORDER — METRONIDAZOLE 500 MG PO TABS: 2000.0000 mg | ORAL_TABLET | Freq: Once | ORAL | 0 refills | Status: AC

## 2017-12-11 NOTE — Telephone Encounter (Signed)
Clinical staff, please let patient know that test for trichomonas was positive.  Rx metronidazole was sent to the pharmacy of record, [facility] on [address] [facility] [address] [place].  Please refrain from sexual intercourse for 7 days to give the medicine time to work.  Sexual partners need to be notified and tested/treated.  Condoms may reduce risk of reinfection.  Test for candida (yeast) was also positive.  Rx fluconazole and clotrimazole cream were given at the urgent care visit.   Recheck for further evaluation if symptoms are not improving.   Jodi Mourning MD

## 2018-09-22 ENCOUNTER — Encounter (HOSPITAL_COMMUNITY): Payer: Self-pay

## 2018-09-22 ENCOUNTER — Other Ambulatory Visit: Payer: Self-pay

## 2018-09-22 ENCOUNTER — Ambulatory Visit (HOSPITAL_COMMUNITY)
Admission: EM | Admit: 2018-09-22 | Discharge: 2018-09-22 | Disposition: A | Discharge status: Home / Self Care | Payer: 59 | Attending: Family Medicine | Admitting: Family Medicine

## 2018-09-22 DIAGNOSIS — F17210 Nicotine dependence, cigarettes, uncomplicated: Secondary | ICD-10-CM | POA: Insufficient documentation

## 2018-09-22 DIAGNOSIS — T192XXA Foreign body in vulva and vagina, initial encounter: Secondary | ICD-10-CM

## 2018-09-22 DIAGNOSIS — N898 Other specified noninflammatory disorders of vagina: Secondary | ICD-10-CM | POA: Diagnosis not present

## 2018-09-22 DIAGNOSIS — Z202 Contact with and (suspected) exposure to infections with a predominantly sexual mode of transmission: Secondary | ICD-10-CM | POA: Diagnosis not present

## 2018-09-22 DIAGNOSIS — Z113 Encounter for screening for infections with a predominantly sexual mode of transmission: Secondary | ICD-10-CM | POA: Diagnosis not present

## 2018-09-22 DIAGNOSIS — Z888 Allergy status to other drugs, medicaments and biological substances status: Secondary | ICD-10-CM | POA: Diagnosis not present

## 2018-09-22 DIAGNOSIS — Z79899 Other long term (current) drug therapy: Secondary | ICD-10-CM | POA: Insufficient documentation

## 2018-09-22 NOTE — ED Triage Notes (Signed)
Pt states she thinks she has a tampon stuck inside her X 3 days.

## 2018-09-22 NOTE — ED Provider Notes (Signed)
[facility]    CSN: [id number] Arrival date & time: 09/22/18  1427     History   Chief Complaint Chief Complaint  Patient presents with  . tampon stuck    HPI Jamie Chung is a 37 y.o. female.   HPI  Patient is here for evaluation of retained tampon.  She states that her period ended today.  She last used a tampon 3 days ago she states she does not remember removing it.  She states she is unable to find it.  She states that she is "scared" because she has heard that they can cause infections and problems. She denies any itching or discharge.  She denies any abdominal pain.  She denies any malodor.  She denies any fever or chills. She does have a single sexual partner.  They do not use condoms.  She states that while getting an exam I "might as well check for STD".  Does not want HIV or RPR in spite of my recommendation.  Past Medical History:  Diagnosis Date  . Allergy   . Hypertension   . Medical history non-contributory     There are no active problems to display for this patient.   Past Surgical History:  Procedure Laterality Date  . TONSILLECTOMY      OB History    Gravida  2   Para  0   Term      Preterm      AB  2   Living  0     SAB  2   TAB      Ectopic      Multiple      Live Births               Home Medications    Prior to Admission medications   Medication Sig Start Date End Date Taking? Authorizing Provider  hydrochlorothiazide (HYDRODIURIL) 50 MG tablet Take 50 mg by mouth daily.    [provider]  hydrOXYzine (ATARAX/VISTARIL) 25 MG tablet Take 1 tablet (25 mg total) by mouth every 6 (six) hours as needed for itching. 12/07/17   Bjorn Pippin, PA-C  losartan (COZAAR) 25 MG tablet Take 25 mg by mouth daily.    [provider]    Family History History reviewed. No pertinent family history.  Social History Social History   Tobacco Use  . Smoking status: Current Some Day Smoker   Packs/day: 0.25    Years: 4.00    Pack years: 1.00    Types: Cigarettes  . Smokeless tobacco: Current User  . Tobacco comment: maybe once/week  Substance Use Topics  . Alcohol use: No    Alcohol/week: 0.0 standard drinks  . Drug use: No     Allergies   Lisinopril and Mobic [meloxicam]   Review of Systems Review of Systems  Constitutional: Negative for chills and fever.  HENT: Negative for ear pain and sore throat.   Eyes: Negative for pain and visual disturbance.  Respiratory: Negative for cough and shortness of breath.   Cardiovascular: Negative for chest pain and palpitations.  Gastrointestinal: Negative for abdominal pain and vomiting.  Genitourinary: Negative for dysuria and hematuria.  Musculoskeletal: Negative for arthralgias and back pain.  Skin: Negative for color change and rash.  Neurological: Negative for seizures and syncope.  All other systems reviewed and are negative.    Physical Exam Triage Vital Signs ED Triage Vitals  Enc Vitals Group     BP 09/22/18 1524 140/90  Pulse --      Resp 09/22/18 1524 16     Temp 09/22/18 1524 97.9 F (36.6 C)     Temp Source 09/22/18 1524 Oral     SpO2 09/22/18 1524 99 %     Weight 09/22/18 1528 210 lb (95.3 kg)     Height --      Head Circumference --      Peak Flow --      Pain Score --      Pain Loc --      Pain Edu? --      Excl. in [place]? --    No data found.  Updated Vital Signs BP 140/90 (BP Location: Right Arm)   Temp 97.9 F (36.6 C) (Oral)   Resp 16   Wt 95.3 kg   LMP 09/22/2018   SpO2 99%   BMI 32.89 kg/m   Visual Acuity Right Eye Distance:   Left Eye Distance:   Bilateral Distance:    Right Eye Near:   Left Eye Near:    Bilateral Near:     Physical Exam  Constitutional: She appears well-developed and well-nourished. No distress.  HENT:  Head: Normocephalic and atraumatic.  Mouth/Throat: Oropharynx is clear and moist.  Eyes: Pupils are equal, round, and reactive to light.  Conjunctivae are normal.  Neck: Normal range of motion.  Cardiovascular: Normal rate.  Pulmonary/Chest: Effort normal. No respiratory distress.  Abdominal: Soft. She exhibits no distension.  Genitourinary: Vagina normal. No vaginal discharge found.  Genitourinary Comments: Scant light brown discharge from cervix.  No tampon.  Bimanual negative no tenderness.  Musculoskeletal: Normal range of motion. She exhibits no edema.  Neurological: She is alert.  Skin: Skin is warm and dry.  Psychiatric: She has a normal mood and affect. Her behavior is normal.     UC Treatments / Results  Labs (all labs ordered are listed, but only abnormal results are displayed) Labs Reviewed  CERVICOVAGINAL ANCILLARY ONLY    EKG None  Radiology No results found.  Procedures Procedures (including critical care time)  Medications Ordered in UC Medications - No data to display  Initial Impression / Assessment and Plan / UC Course  I have reviewed the triage vital signs and the nursing notes.  Pertinent labs & imaging results that were available during my care of the patient were reviewed by me and considered in my medical decision making (see chart for details).      Final Clinical Impressions(s) / UC Diagnoses   Final diagnoses:  Possible exposure to STD     Discharge Instructions     We did lab testing during this visit.  If there are any abnormal findings that require change in medicine or indicate a positive result, you will be notified.  If all of your tests are normal, you will not be called.      ED Prescriptions    None     Controlled Substance Prescriptions [place] Controlled Substance Registry consulted? Not Applicable   Raylene Everts, MD 09/22/18 [phone]

## 2018-09-22 NOTE — Discharge Instructions (Signed)
We did lab testing during this visit.  If there are any abnormal findings that require change in medicine or indicate a positive result, you will be notified.  If all of your tests are normal, you will not be called.   ° °

## 2018-09-23 LAB — CERVICOVAGINAL ANCILLARY ONLY
Chlamydia: NEGATIVE
Neisseria Gonorrhea: NEGATIVE
TRICH (WINDOWPATH): POSITIVE — AB

## 2018-09-24 ENCOUNTER — Telehealth (HOSPITAL_COMMUNITY): Payer: Self-pay | Admitting: Emergency Medicine

## 2018-09-24 MED ORDER — METRONIDAZOLE 500 MG PO TABS: 500.0000 mg | ORAL_TABLET | Freq: Two times a day (BID) | ORAL | 0 refills | Status: DC

## 2018-10-23 ENCOUNTER — Encounter (HOSPITAL_COMMUNITY): Payer: Self-pay

## 2018-10-23 ENCOUNTER — Other Ambulatory Visit: Payer: Self-pay

## 2018-10-23 ENCOUNTER — Emergency Department (HOSPITAL_COMMUNITY)
Admission: EM | Admit: 2018-10-23 | Discharge: 2018-10-24 | Disposition: A | Discharge status: Home / Self Care | Payer: 59 | Attending: Emergency Medicine | Admitting: Emergency Medicine

## 2018-10-23 DIAGNOSIS — J069 Acute upper respiratory infection, unspecified: Secondary | ICD-10-CM

## 2018-10-23 DIAGNOSIS — Z79899 Other long term (current) drug therapy: Secondary | ICD-10-CM | POA: Insufficient documentation

## 2018-10-23 DIAGNOSIS — N760 Acute vaginitis: Secondary | ICD-10-CM | POA: Diagnosis not present

## 2018-10-23 DIAGNOSIS — B9689 Other specified bacterial agents as the cause of diseases classified elsewhere: Secondary | ICD-10-CM

## 2018-10-23 DIAGNOSIS — R05 Cough: Secondary | ICD-10-CM | POA: Diagnosis not present

## 2018-10-23 DIAGNOSIS — I10 Essential (primary) hypertension: Secondary | ICD-10-CM | POA: Insufficient documentation

## 2018-10-23 DIAGNOSIS — N898 Other specified noninflammatory disorders of vagina: Secondary | ICD-10-CM | POA: Diagnosis present

## 2018-10-23 DIAGNOSIS — F17210 Nicotine dependence, cigarettes, uncomplicated: Secondary | ICD-10-CM | POA: Diagnosis not present

## 2018-10-23 NOTE — ED Triage Notes (Signed)
Pt reports sore throat x 2 weeks. Coughing up clear phlegm. Denies hemoptysis.   Pt also reports small amounts of yellow, foul smelling discharge. She denies any new sexual partners, but endorses unprotected sex with the same partner. No pain, bleeding or itching.

## 2018-10-24 LAB — URINALYSIS, ROUTINE W REFLEX MICROSCOPIC
Bilirubin Urine: NEGATIVE
Glucose, UA: NEGATIVE mg/dL
HGB URINE DIPSTICK: NEGATIVE
Ketones, ur: 20 mg/dL — AB
Leukocytes, UA: NEGATIVE
Nitrite: NEGATIVE
PROTEIN: NEGATIVE mg/dL
SPECIFIC GRAVITY, URINE: 1.004 — AB (ref 1.005–1.030)
pH: 7 (ref 5.0–8.0)

## 2018-10-24 LAB — WET PREP, GENITAL
SPERM: NONE SEEN
Trich, Wet Prep: NONE SEEN
YEAST WET PREP: NONE SEEN

## 2018-10-24 LAB — HIV ANTIBODY (ROUTINE TESTING W REFLEX): HIV Screen 4th Generation wRfx: NONREACTIVE

## 2018-10-24 LAB — RPR: RPR Ser Ql: NONREACTIVE

## 2018-10-24 LAB — POC URINE PREG, ED: PREG TEST UR: NEGATIVE

## 2018-10-24 MED ORDER — CEFTRIAXONE SODIUM 250 MG IJ SOLR
250.0000 mg | Freq: Once | INTRAMUSCULAR | Status: AC
  Administered 2018-10-24: 250 mg via INTRAMUSCULAR
  Filled 2018-10-24: qty 250

## 2018-10-24 MED ORDER — METRONIDAZOLE 0.75 % VA GEL: 1.0000 | Freq: Two times a day (BID) | VAGINAL | 0 refills | Status: DC

## 2018-10-24 MED ORDER — CLINDAMYCIN HCL 150 MG PO CAPS: 300.0000 mg | ORAL_CAPSULE | Freq: Two times a day (BID) | ORAL | 0 refills | Status: AC

## 2018-10-24 MED ORDER — AZITHROMYCIN 250 MG PO TABS
1000.0000 mg | ORAL_TABLET | Freq: Once | ORAL | Status: AC
  Administered 2018-10-24: 1000 mg via ORAL
  Filled 2018-10-24: qty 4

## 2018-10-24 MED ORDER — ACETAMINOPHEN 325 MG PO TABS
650.0000 mg | ORAL_TABLET | Freq: Once | ORAL | Status: AC
  Administered 2018-10-24: 650 mg via ORAL
  Filled 2018-10-24: qty 2

## 2018-10-24 MED ORDER — LIDOCAINE HCL 1 % IJ SOLN
INTRAMUSCULAR | Status: AC
  Administered 2018-10-24: 03:00:00
  Filled 2018-10-24: qty 20

## 2018-10-24 MED ORDER — AMOXICILLIN 500 MG PO CAPS: 1000.0000 mg | ORAL_CAPSULE | Freq: Two times a day (BID) | ORAL | 0 refills | Status: DC

## 2018-10-24 NOTE — ED Provider Notes (Signed)
Patient called Jamie Rutherford, RN regarding not receiving a prescription for Flagyl from this morning for BV.  I reviewed the patient's chart, and it appears that patient was supposed to receive a Flagyl prescription.  RN will call in medications.  Patient prefers Flagyl tablet.  Ensure that patient received information regarding no alcohol for duration of prescription +48 hours after.     Albesa Seen, PA-C 10/24/18 [place], [place], DO 10/25/18 [phone]

## 2018-10-24 NOTE — ED Notes (Signed)
Pt has pelvic set up at bedside.

## 2018-10-24 NOTE — Discharge Instructions (Addendum)
Take medications as prescribed for upper respiratory infection (Amoxil) and bacterial vaginitis (Metrogel). Follow up with your doctor for recheck if symptoms persist.

## 2018-10-24 NOTE — ED Provider Notes (Signed)
[facility] DEPT Provider Note   CSN: [id number] Arrival date & time: 10/23/18  2347     History   Chief Complaint Chief Complaint  Patient presents with  . Sore Throat  . Vaginal Discharge    HPI Jamie Chung is a 37 y.o. female.  Patient complains of sore throat, nasal congestion, productive cough without fever for the past 2 weeks. She has been using OTC medications without significant improvement. No vomiting or diarrhea. She has a frontal headache that is worse with coughing. She also complains of vaginal discharge that is yellow with malodor. Vaginal discharge started several days ago. No dysuria. She reports one sexual partner, no barrier protection.  The history is provided by the patient. No language interpreter was used.  Sore Throat  Pertinent negatives include no chest pain, no abdominal pain and no shortness of breath.  Vaginal Discharge   Pertinent negatives include no fever, no abdominal pain, no diarrhea, no nausea, no vomiting and no dysuria.    Past Medical History:  Diagnosis Date  . Allergy   . Hypertension   . Medical history non-contributory     There are no active problems to display for this patient.   Past Surgical History:  Procedure Laterality Date  . TONSILLECTOMY       OB History    Gravida  2   Para  0   Term      Preterm      AB  2   Living  0     SAB  2   TAB      Ectopic      Multiple      Live Births               Home Medications    Prior to Admission medications   Medication Sig Start Date End Date Taking? Authorizing Provider  hydrochlorothiazide (HYDRODIURIL) 50 MG tablet Take 50 mg by mouth daily.    [provider]  hydrOXYzine (ATARAX/VISTARIL) 25 MG tablet Take 1 tablet (25 mg total) by mouth every 6 (six) hours as needed for itching. 12/07/17   Bjorn Pippin, PA-C  losartan (COZAAR) 25 MG tablet Take 25 mg by mouth daily.    [provider]  metroNIDAZOLE (FLAGYL) 500 MG tablet Take 1 tablet (500 mg total) by mouth 2 (two) times daily. 09/24/18   Vanessa Kick, MD    Family History History reviewed. No pertinent family history.  Social History Social History   Tobacco Use  . Smoking status: Current Some Day Smoker    Packs/day: 0.25    Years: 4.00    Pack years: 1.00    Types: Cigarettes  . Smokeless tobacco: Current User  . Tobacco comment: maybe once/week  Substance Use Topics  . Alcohol use: No    Alcohol/week: 0.0 standard drinks  . Drug use: No     Allergies   Lisinopril and Mobic [meloxicam]   Review of Systems Review of Systems  Constitutional: Negative for fever.  HENT: Positive for congestion, sinus pressure and sore throat.   Eyes: Negative for discharge.  Respiratory: Positive for cough. Negative for shortness of breath.   Cardiovascular: Negative for chest pain.  Gastrointestinal: Negative for abdominal pain, diarrhea, nausea and vomiting.  Genitourinary: Positive for vaginal discharge. Negative for dysuria and pelvic pain.  Musculoskeletal: Negative for back pain.     Physical Exam Updated Vital Signs BP (!) 156/94   Pulse 88  Temp 98.1 F (36.7 C) (Oral)   Resp 16   SpO2 99%   Physical Exam  Constitutional: She appears well-developed and well-nourished.  HENT:  Head: Normocephalic.  Right Ear: Tympanic membrane normal.  Left Ear: Tympanic membrane normal.  Mouth/Throat: Mucous membranes are normal. No uvula swelling. Posterior oropharyngeal erythema present. No oropharyngeal exudate. No tonsillar exudate.  Neck: Normal range of motion. Neck supple.  Cardiovascular: Normal rate and regular rhythm.  Pulmonary/Chest: Effort normal and breath sounds normal.  Abdominal: Soft. Bowel sounds are normal. There is no tenderness. There is no rebound and no guarding.  Genitourinary: Cervix exhibits no motion tenderness, no discharge and no friability. Right adnexum displays no mass and  no tenderness. Left adnexum displays no mass and no tenderness. No tenderness or bleeding in the vagina. Vaginal discharge found.  Musculoskeletal: Normal range of motion.  Neurological: She is alert. No cranial nerve deficit.  Skin: Skin is warm and dry. No rash noted.  Psychiatric: She has a normal mood and affect.     ED Treatments / Results  Labs (all labs ordered are listed, but only abnormal results are displayed) Labs Reviewed  URINALYSIS, ROUTINE W REFLEX MICROSCOPIC - Abnormal; Notable for the following components:      Result Value   Color, Urine STRAW (*)    Specific Gravity, Urine 1.004 (*)    Ketones, ur 20 (*)    All other components within normal limits  WET PREP, GENITAL  POC URINE PREG, ED  GC/CHLAMYDIA PROBE AMP ([place]) NOT AT [facility]    EKG None  Radiology No results found.  Procedures Procedures (including critical care time)  Medications Ordered in ED Medications - No data to display   Initial Impression / Assessment and Plan / ED Course  I have reviewed the triage vital signs and the nursing notes.  Pertinent labs & imaging results that were available during my care of the patient were reviewed by me and considered in my medical decision making (see chart for details).     Patient with complaint of URI symptoms x 2 weeks without fever, and vaginal discharge x 3 days, concerned for STD. No pelvic pain  Nontoxic in appearance. Suspect viral process to explain URI symptoms but given the duration of symptoms will treat with abx and recommend PCP follow up.   There is evidence of BV on pelvic exam. No pelvic tenderness to suggest PID. No fever or white count. Will treat with Flagyl. Discussed treatment for STD here with Rocephin and Zithromax which patient would like to receive. Cultures, RPR and HIV pending.   Final Clinical Impressions(s) / ED Diagnoses   Final diagnoses:  None   1. URI 2. [place]  ED Discharge Orders    None         Charlann Lange, Hershal Coria 10/24/18 0302    Ripley Fraise, MD 10/24/18 [phone]

## 2018-10-27 LAB — GC/CHLAMYDIA PROBE AMP (~~LOC~~) NOT AT ARMC
Chlamydia: NEGATIVE
Neisseria Gonorrhea: NEGATIVE

## 2018-12-12 ENCOUNTER — Ambulatory Visit (HOSPITAL_COMMUNITY)
Admission: EM | Admit: 2018-12-12 | Discharge: 2018-12-12 | Disposition: A | Discharge status: Home / Self Care | Payer: 59 | Attending: Family Medicine | Admitting: Family Medicine

## 2018-12-12 ENCOUNTER — Encounter (HOSPITAL_COMMUNITY): Payer: Self-pay | Admitting: Emergency Medicine

## 2018-12-12 DIAGNOSIS — R059 Cough, unspecified: Secondary | ICD-10-CM

## 2018-12-12 DIAGNOSIS — R05 Cough: Secondary | ICD-10-CM | POA: Insufficient documentation

## 2018-12-12 MED ORDER — BENZONATATE 200 MG PO CAPS: 200.0000 mg | ORAL_CAPSULE | Freq: Two times a day (BID) | ORAL | 0 refills | Status: DC | PRN

## 2018-12-12 MED ORDER — PREDNISONE 20 MG PO TABS: 20.0000 mg | ORAL_TABLET | Freq: Two times a day (BID) | ORAL | 0 refills | Status: DC

## 2018-12-12 NOTE — ED Triage Notes (Signed)
Pt c/o cough x2 months, states shes had URI for the last few months.

## 2018-12-12 NOTE — ED Provider Notes (Signed)
[place]    CSN: [id number] Arrival date & time: 12/12/18  1812     History   Chief Complaint Chief Complaint  Patient presents with  . Cough    HPI Jamie Chung is a 37 y.o. female.   HPI  Patient states that she had a cough and cold a couple months ago.  She states that the cold went away but the cough persisted.  She is now been coughing for 2 months.  She coughs more in the morning.  She feels like she has some mucus in her upper chest.  She states she also has a lot of GERD and takes over-the-counter medications.  She does have some allergies and postnasal drip.  She has no asthma or wheezing.  He is on no medications that would cause coughing. We reviewed the causes of chronic cough.  Cough variant asthma, lung disease, GERD, postnasal drip, bronchial inflammation left over from a viral bronchitis.  Past Medical History:  Diagnosis Date  . Allergy   . Hypertension   . Medical history non-contributory     There are no active problems to display for this patient.   Past Surgical History:  Procedure Laterality Date  . TONSILLECTOMY      OB History    Gravida  2   Para  0   Term      Preterm      AB  2   Living  0     SAB  2   TAB      Ectopic      Multiple      Live Births               Home Medications    Prior to Admission medications   Medication Sig Start Date End Date Taking? Authorizing Provider  amLODipine (NORVASC) 5 MG tablet TAKE 1 TABLET(5 MG) BY MOUTH DAILY 02/24/18  Yes [provider]  benzonatate (TESSALON) 200 MG capsule Take 1 capsule (200 mg total) by mouth 2 (two) times daily as needed for cough. 12/12/18   Raylene Everts, MD  hydrochlorothiazide (HYDRODIURIL) 50 MG tablet Take 50 mg by mouth daily.    [provider]  hydrOXYzine (ATARAX/VISTARIL) 25 MG tablet Take 1 tablet (25 mg total) by mouth every 6 (six) hours as needed for itching. Patient not taking: Reported on  12/12/2018 12/07/17   Bjorn Pippin, PA-C  losartan (COZAAR) 25 MG tablet Take 25 mg by mouth daily.    [provider]  metroNIDAZOLE (FLAGYL) 500 MG tablet Take 1 tablet (500 mg total) by mouth 2 (two) times daily. Patient not taking: Reported on 12/12/2018 09/24/18   Vanessa Kick, MD  predniSONE (DELTASONE) 20 MG tablet Take 1 tablet (20 mg total) by mouth 2 (two) times daily with a meal. 12/12/18   Raylene Everts, MD    Family History No family history on file.  Social History Social History   Tobacco Use  . Smoking status: Current Some Day Smoker    Packs/day: 0.25    Years: 4.00    Pack years: 1.00    Types: Cigarettes  . Smokeless tobacco: Current User  . Tobacco comment: maybe once/week  Substance Use Topics  . Alcohol use: No    Alcohol/week: 0.0 standard drinks  . Drug use: No     Allergies   Lisinopril and Mobic [meloxicam]   Review of Systems Review of Systems  Constitutional: Negative for chills and fever.  HENT: Positive for postnasal drip. Negative for ear pain and sore throat.   Eyes: Negative for pain and visual disturbance.  Respiratory: Positive for cough. Negative for shortness of breath.   Cardiovascular: Negative for chest pain and palpitations.  Gastrointestinal: Positive for abdominal pain. Negative for vomiting.  Genitourinary: Negative for dysuria and hematuria.  Musculoskeletal: Negative for arthralgias and back pain.  Skin: Negative for color change and rash.  Neurological: Negative for seizures and syncope.  All other systems reviewed and are negative.    Physical Exam Triage Vital Signs ED Triage Vitals  Enc Vitals Group     BP 12/12/18 1921 (!) 146/99     Pulse Rate 12/12/18 1921 (!) 101     Resp 12/12/18 1921 18     Temp 12/12/18 1921 97.8 F (36.6 C)     Temp src --      SpO2 12/12/18 1921 100 %     Weight --      Height --      Head Circumference --      Peak Flow --      Pain Score 12/12/18 1922 6      Pain Loc --      Pain Edu? --      Excl. in [place]? --    No data found.  Updated Vital Signs BP (!) 146/99   Pulse (!) 101   Temp 97.8 F (36.6 C)   Resp 18   LMP 11/12/2018   SpO2 100%       Physical Exam Constitutional:      General: She is not in acute distress.    Appearance: She is well-developed.  HENT:     Head: Normocephalic and atraumatic.     Nose: Nose normal. No congestion or rhinorrhea.     Mouth/Throat:     Comments: Oropharynx benign Eyes:     Conjunctiva/sclera: Conjunctivae normal.     Pupils: Pupils are equal, round, and reactive to light.  Neck:     Musculoskeletal: Normal range of motion.  Cardiovascular:     Rate and Rhythm: Normal rate and regular rhythm.     Heart sounds: Normal heart sounds.  Pulmonary:     Effort: Pulmonary effort is normal. No respiratory distress.     Comments: Lungs are clear Abdominal:     General: Abdomen is flat. There is no distension.     Palpations: Abdomen is soft.     Comments: No epigastric tenderness  Musculoskeletal: Normal range of motion.  Skin:    General: Skin is warm and dry.  Neurological:     General: No focal deficit present.     Mental Status: She is alert.  Psychiatric:        Mood and Affect: Mood normal.        Thought Content: Thought content normal.      UC Treatments / Results  Labs (all labs ordered are listed, but only abnormal results are displayed) Labs Reviewed - No data to display  EKG None  Radiology No results found.  Procedures Procedures (including critical care time)  Medications Ordered in UC Medications - No data to display  Initial Impression / Assessment and Plan / UC Course  I have reviewed the triage vital signs and the nursing notes.  Pertinent labs & imaging results that were available during my care of the patient were reviewed by me and considered in my medical decision making (see chart for details).     After  discussion regarding the causes of  chronic cough, patient is a bit argumentative and states that she does not think it is from her GERD.  She does not think it is asthma but she does say that she wheezes at night.  I think she does have some bronchial inflammation left over from her cold and perhaps some postnasal drip.  We will treat her with prednisone and Tessalon she can follow-up with her PCP Final Clinical Impressions(s) / UC Diagnoses   Final diagnoses:  Cough     Discharge Instructions     Drink more water Run a humidifier in the bedroom if you have one Tessalon every 12 hours.  This is for cough Take prednisone for 5 days.  This is for bronchial irritation See your doctor if not improved in a week or 2    ED Prescriptions    Medication Sig Dispense Auth. Provider   predniSONE (DELTASONE) 20 MG tablet Take 1 tablet (20 mg total) by mouth 2 (two) times daily with a meal. 10 tablet Raylene Everts, MD   benzonatate (TESSALON) 200 MG capsule Take 1 capsule (200 mg total) by mouth 2 (two) times daily as needed for cough. 20 capsule Raylene Everts, MD     Controlled Substance Prescriptions [place] Controlled Substance Registry consulted? na   Raylene Everts, MD 12/12/18 2102

## 2018-12-12 NOTE — Discharge Instructions (Addendum)
Drink more water Run a humidifier in the bedroom if you have one Tessalon every 12 hours.  This is for cough Take prednisone for 5 days.  This is for bronchial irritation See your doctor if not improved in a week or 2

## 2018-12-26 ENCOUNTER — Encounter: Payer: Self-pay | Admitting: Certified Nurse Midwife

## 2019-01-06 ENCOUNTER — Ambulatory Visit (INDEPENDENT_AMBULATORY_CARE_PROVIDER_SITE_OTHER): Payer: 59 | Admitting: Certified Nurse Midwife

## 2019-01-06 ENCOUNTER — Other Ambulatory Visit: Payer: Self-pay

## 2019-01-06 ENCOUNTER — Other Ambulatory Visit (HOSPITAL_COMMUNITY)
Admission: RE | Admit: 2019-01-06 | Discharge: 2019-01-06 | Disposition: A | Discharge status: Home / Self Care | Payer: 59 | Source: Ambulatory Visit | Attending: Certified Nurse Midwife | Admitting: Certified Nurse Midwife

## 2019-01-06 ENCOUNTER — Encounter: Payer: Self-pay | Admitting: Certified Nurse Midwife

## 2019-01-06 VITALS — BP 132/80 | HR 78 | Resp 16 | Ht 67.5 in | Wt 209.0 lb

## 2019-01-06 DIAGNOSIS — R309 Painful micturition, unspecified: Secondary | ICD-10-CM | POA: Diagnosis not present

## 2019-01-06 DIAGNOSIS — Z01419 Encounter for gynecological examination (general) (routine) without abnormal findings: Secondary | ICD-10-CM | POA: Diagnosis not present

## 2019-01-06 DIAGNOSIS — N800 Endometriosis of uterus: Secondary | ICD-10-CM | POA: Insufficient documentation

## 2019-01-06 DIAGNOSIS — N926 Irregular menstruation, unspecified: Secondary | ICD-10-CM

## 2019-01-06 DIAGNOSIS — Z124 Encounter for screening for malignant neoplasm of cervix: Secondary | ICD-10-CM | POA: Diagnosis not present

## 2019-01-06 DIAGNOSIS — Z113 Encounter for screening for infections with a predominantly sexual mode of transmission: Secondary | ICD-10-CM

## 2019-01-06 DIAGNOSIS — N938 Other specified abnormal uterine and vaginal bleeding: Secondary | ICD-10-CM | POA: Insufficient documentation

## 2019-01-06 DIAGNOSIS — N912 Amenorrhea, unspecified: Secondary | ICD-10-CM

## 2019-01-06 DIAGNOSIS — N8003 Adenomyosis of the uterus: Secondary | ICD-10-CM | POA: Insufficient documentation

## 2019-01-06 LAB — POCT URINALYSIS DIPSTICK
BILIRUBIN UA: NEGATIVE
Blood, UA: NEGATIVE
GLUCOSE UA: NEGATIVE
KETONES UA: NEGATIVE
Leukocytes, UA: NEGATIVE
Nitrite, UA: NEGATIVE
PH UA: 5 (ref 5.0–8.0)
Protein, UA: NEGATIVE
UROBILINOGEN UA: NEGATIVE U/dL — AB

## 2019-01-06 LAB — POCT URINE PREGNANCY: Preg Test, Ur: NEGATIVE

## 2019-01-06 NOTE — Patient Instructions (Signed)

## 2019-01-06 NOTE — Progress Notes (Signed)
38 y.o. [id number] Single  African American Fe here to establish gyn care and  for annual exam. Periods normal, monthly. Sees PCP [facility] for management of hypertension and allergies.. Periods have been normal, except for spotting after she stopped her OCP. Plans to use condoms and not use pills at this time. Had history of amenorrhea in past due to PCOS history. Current daily smoker,but is trying to decrease now. Also history of migraines no aura. treated with spinal fluid draw and no more occurrences. No partner change,  STD screening needed. Patient would like to try for pregnancy again, but aware she needs to have better health habits. No other health concerns today.  Patient's last menstrual period was 12/04/2018 (exact date).          Sexually active: Yes.    The current method of family planning is none.    Exercising: No.  exercise Smoker:  no  Review of Systems  Constitutional: Negative.   HENT: Negative.   Eyes: Negative.   Respiratory: Negative.   Cardiovascular: Negative.   Gastrointestinal: Negative.   Genitourinary:       Pain with urination & irregular cycles  Musculoskeletal: Negative.   Skin: Negative.   Neurological: Negative.   Endo/Heme/Allergies: Negative.   Psychiatric/Behavioral: Negative.     Health Maintenance: Pap:  2018 per patient History of Abnormal Pap: no MMG: 2018 or 2019 Self Breast exams: no Colonoscopy:  none BMD:   none TDaP:  2010 Shingles: no Pneumonia: no Hep C and HIV: HIV neg 2019 Labs: poct urine-neg, upt-neg   reports that she has been smoking cigarettes. She has a 1.00 pack-year smoking history. She uses smokeless tobacco. She reports current alcohol use of about 3.0 standard drinks of alcohol per week. She reports that she does not use drugs.  Past Medical History:  Diagnosis Date  . Allergy   . Anemia   . Hypertension   . Medical history non-contributory   . Migraines   . PCOS (polycystic ovarian syndrome)   . STD  (sexually transmitted disease)    trichomonas    Past Surgical History:  Procedure Laterality Date  . LUMBAR PUNCTURE    . TONSILLECTOMY      Current Outpatient Medications  Medication Sig Dispense Refill  . amLODipine (NORVASC) 5 MG tablet TAKE 1 TABLET(5 MG) BY MOUTH DAILY    . hydrochlorothiazide (HYDRODIURIL) 25 MG tablet     . hydrOXYzine (ATARAX/VISTARIL) 25 MG tablet Take 1 tablet (25 mg total) by mouth every 6 (six) hours as needed for itching. 12 tablet 0  . Omega-3 Fatty Acids (FISH OIL PO) Take by mouth.    Marland Kitchen UNABLE TO FIND Joint mobility     No current facility-administered medications for this visit.     Family History  Problem Relation Age of Onset  . Hypertension Mother   . Hypertension Maternal Grandmother   . Heart attack Maternal Grandfather   . Breast cancer Paternal Grandmother     ROS:  Pertinent items are noted in HPI.  Otherwise, a comprehensive ROS was negative.  Exam:   BP (!) 158/88   Pulse 70   Resp 16   Ht 5' 7.5" (1.715 m)   Wt 209 lb (94.8 kg)   LMP 12/04/2018 (Exact Date) Comment: spotting 12/2018  BMI 32.25 kg/m  Height: 5' 7.5" (171.5 cm) Ht Readings from Last 3 Encounters:  01/06/19 5' 7.5" (1.715 m)  06/01/17 5\' 7"  (1.702 m)  05/22/16 5\' 8"  (1.727 m)  General appearance: alert, cooperative and appears stated age Head: Normocephalic, without obvious abnormality, atraumatic Neck: no adenopathy, supple, symmetrical, trachea midline and thyroid normal to inspection and palpation Lungs: clear to auscultation bilaterally Breasts: normal appearance, no masses or tenderness, No nipple retraction or dimpling, No nipple discharge or bleeding, No axillary or supraclavicular adenopathy Heart: regular rate and rhythm Abdomen: soft, non-tender; no masses,  no organomegaly Extremities: extremities normal, atraumatic, no cyanosis or edema Skin: Skin color, texture, turgor normal. No rashes or lesions Lymph nodes: Cervical, supraclavicular,  and axillary nodes normal. No abnormal inguinal nodes palpated Neurologic: Grossly normal   Pelvic: External genitalia:  no lesions              Urethra:  normal appearing urethra with no masses, tenderness or lesions              Bartholin's and Skene's: normal                 Vagina: normal appearing vagina with normal color and discharge, no lesions              Cervix: no cervical motion tenderness, no lesions and normal appearance              Pap taken: Yes.   Bimanual Exam:  Uterus:  normal size, contour, position, consistency, mobility, non-tender and anteflexed              Adnexa: normal adnexa and no mass, fullness, tenderness               Rectovaginal: Confirms               Anus:  normal sphincter tone, no lesions  Chaperone present: yes  A:  Well Woman with normal exam  Contraception previous OCP, stopped, condom use  Hypertension with PCP management  Smoker  PCOS history  STD screening  P:   Reviewed health and wellness pertinent to exam  Discussed recommend POP due to smoker and hypertensive, which increase risk of stroke or blood clot with use. Discussed instructions with use and bleeding expectations. Patient will use condoms or abstain until has serum HCG that is negative to start her POP. No Rx given.  Lab: Serum HCG qualitative, TSH  Discussed weight loss, exercise to help with BP control.  Declines cessation information.  Patient will keep menses record, but is aware she may no have period prior to lab appointment.  Lab: GC/chlamydia, Affirm  Pap smear: yes   counseled on breast self exam, STD prevention, HIV risk factors and prevention, feminine hygiene, use and side effects of OCP's, adequate intake of calcium and vitamin D, diet and exercise  return annually or prn  An After Visit Summary was printed and given to the patient.

## 2019-01-07 ENCOUNTER — Telehealth: Payer: Self-pay | Admitting: Certified Nurse Midwife

## 2019-01-07 NOTE — Telephone Encounter (Signed)
Patient called to schedule labs. Patient stated that it was for HCG and HPV. Please confirm. Appointment is currently set for 01/20/19 at 3:30PM. Patient aware appointment may need to be moved if needing fasting labs.

## 2019-01-07 NOTE — Telephone Encounter (Signed)
Per review of OV notes dated 01/06/19, Patient will use condoms or abstain until has serum HCG that is negative to start her POP.    Patient has scheduled lab appt for 01/20/19 at 3:30pm. Future lab orders placed by Melvia Heaps, CNM for Hcg and TSH.   Routing to Melvia Heaps, CNM  Encounter closed.

## 2019-01-08 ENCOUNTER — Telehealth: Payer: Self-pay | Admitting: Certified Nurse Midwife

## 2019-01-08 LAB — VAGINITIS/VAGINOSIS, DNA PROBE
Candida Species: NEGATIVE
GARDNERELLA VAGINALIS: NEGATIVE
TRICHOMONAS VAG: NEGATIVE

## 2019-01-08 LAB — GC/CHLAMYDIA PROBE AMP
Chlamydia trachomatis, NAA: NEGATIVE
Neisseria gonorrhoeae by PCR: NEGATIVE

## 2019-01-08 NOTE — Telephone Encounter (Signed)
Karen Chafe RN reviewed call with Debbi Leonard,CNM. Patient had negative POCT UA on 01-06-2019. Needs office visit for evaluation.   Call to patient. States felt UTI "might be coming on" at appointment on 01-06-2019. Reports increased symptoms yesterday including intermittent back pain, frequency and bladder discomfort after voiding. Denies fever.Motrin helps pain. Advised that per Debbi, with negative UA, office visit needed for re-evaluation. Patient states she cannot come in for office visit due to schedule conflict. Recommend urgent care or insta-care alternative.  Patient states she will seek alternate option.  Routing to [place], CNM. Encounter closed.

## 2019-01-08 NOTE — Telephone Encounter (Signed)
Patient called back and scheduled office visit for tomorrow.

## 2019-01-08 NOTE — Telephone Encounter (Signed)
At patient return call can offer her office visit at her convenience.  Needs office visit for UTI treatment.   Message left to return call to [place] at [phone].

## 2019-01-08 NOTE — Telephone Encounter (Signed)
Patient believes she has a bladder infection. States that she told Ms Jackelyn Poling at her visit on 01/06/19 that she felt like she was getting one. Would like something called in. Patient aware that she may need to be seen.

## 2019-01-08 NOTE — Telephone Encounter (Signed)
Patient stated that she was "just in here" and had a urinalysis done. Patient stated that results were negative and doesn't understand why she needs to come back in for the same symptoms. Patient offered office visit and declined.

## 2019-01-09 ENCOUNTER — Encounter: Payer: Self-pay | Admitting: Certified Nurse Midwife

## 2019-01-09 ENCOUNTER — Ambulatory Visit (INDEPENDENT_AMBULATORY_CARE_PROVIDER_SITE_OTHER): Payer: 59 | Admitting: Certified Nurse Midwife

## 2019-01-09 ENCOUNTER — Other Ambulatory Visit: Payer: Self-pay

## 2019-01-09 ENCOUNTER — Ambulatory Visit: Payer: 59

## 2019-01-09 VITALS — BP 120/80 | HR 70 | Temp 98.6°F | Resp 16 | Wt 212.0 lb

## 2019-01-09 DIAGNOSIS — N390 Urinary tract infection, site not specified: Secondary | ICD-10-CM | POA: Diagnosis not present

## 2019-01-09 DIAGNOSIS — T3695XA Adverse effect of unspecified systemic antibiotic, initial encounter: Secondary | ICD-10-CM | POA: Diagnosis not present

## 2019-01-09 DIAGNOSIS — B379 Candidiasis, unspecified: Secondary | ICD-10-CM

## 2019-01-09 LAB — POCT URINALYSIS DIPSTICK
Bilirubin, UA: NEGATIVE
Glucose, UA: NEGATIVE
Ketones, UA: NEGATIVE
LEUKOCYTES UA: NEGATIVE
NITRITE UA: POSITIVE
PROTEIN UA: NEGATIVE
RBC UA: NEGATIVE
UROBILINOGEN UA: NEGATIVE U/dL — AB
pH, UA: 5 (ref 5.0–8.0)

## 2019-01-09 MED ORDER — FLUCONAZOLE 150 MG PO TABS: ORAL_TABLET | ORAL | 0 refills | Status: DC

## 2019-01-09 MED ORDER — NITROFURANTOIN MONOHYD MACRO 100 MG PO CAPS: ORAL_CAPSULE | ORAL | 0 refills | Status: DC

## 2019-01-09 NOTE — Progress Notes (Signed)
38 y.o. Single African American female [id number] here with complaint of UTI, with onset  on last 24 hours, was seen here as new patient, 3 days ago with slight frequency, but negative urine and culture. . Patient complaining of urinary frequency/urgency/ and pain with urination now and has increased. Denies blood in urine, but urine odor.. Patient denies fever, chills, nausea or back pain. No new personal products. Patient feels not related to sexual activity. Denies any vaginal symptoms.  Patient states she always has a yeast infection with treatment with antibiotics and request medication to prevent.   Contraception is condoms.Patient trying to consume adequate water intake.  Review of Systems  Constitutional: Negative.   HENT: Negative.   Eyes: Negative.   Respiratory: Negative.   Cardiovascular: Negative.   Gastrointestinal: Negative.   Genitourinary: Positive for urgency.       Tingling at end of urination  Musculoskeletal: Negative.   Skin: Negative.   Neurological: Negative.   Endo/Heme/Allergies: Negative.   Psychiatric/Behavioral: Negative.     O: Healthy female WDWN Affect: Normal, orientation x 3 Skin : warm and dry CVAT: negative bilateral Abdomen: positive for suprapubic tenderness  Pelvic exam: External genital area: normal, no lesions Bladder tender,Urethra tender, Urethral meatus: tender, no redness Vagina: normal vaginal discharge, normal appearance   Cervix: normal, non tender Uterus:normal,non tender Adnexa: normal non tender, no fullness or masses   A: UTI Normal pelvic exam poct urine- nitrite positive Hydration needs to be increased History of antibiotic induced yeast infection  P: Reviewed findings of UTI and need for treatment. TF:[id number] 100 mg bid x 7 days with instructions see order [id number] culture Reviewed warning signs and symptoms of UTI and need to advise if occurring. Rx Diflucan 150 mg see order with instructions. Encouraged to limit soda,  tea, and coffee and be sure to increase water intake.   RV prn

## 2019-01-09 NOTE — Patient Instructions (Signed)
Urinary Tract Infection, Adult A urinary tract infection (UTI) is an infection of any part of the urinary tract. The urinary tract includes:  The kidneys.  The ureters.  The bladder.  The urethra. These organs make, store, and get rid of pee (urine) in the body. What are the causes? This is caused by germs (bacteria) in your genital area. These germs grow and cause swelling (inflammation) of your urinary tract. What increases the risk? You are more likely to develop this condition if:  You have a small, thin tube (catheter) to drain pee.  You cannot control when you pee or poop (incontinence).  You are female, and: ? You use these methods to prevent pregnancy: ? A medicine that kills sperm (spermicide). ? A device that blocks sperm (diaphragm). ? You have low levels of a female hormone (estrogen). ? You are pregnant.  You have genes that add to your risk.  You are sexually active.  You take antibiotic medicines.  You have trouble peeing because of: ? A prostate that is bigger than normal, if you are female. ? A blockage in the part of your body that drains pee from the bladder (urethra). ? A kidney stone. ? A nerve condition that affects your bladder (neurogenic bladder). ? Not getting enough to drink. ? Not peeing often enough.  You have other conditions, such as: ? Diabetes. ? A weak disease-fighting system (immune system). ? Sickle cell disease. ? Gout. ? Injury of the spine. What are the signs or symptoms? Symptoms of this condition include:  Needing to pee right away (urgently).  Peeing often.  Peeing small amounts often.  Pain or burning when peeing.  Blood in the pee.  Pee that smells bad or not like normal.  Trouble peeing.  Pee that is cloudy.  Fluid coming from the vagina, if you are female.  Pain in the belly or lower back. Other symptoms include:  Throwing up (vomiting).  No urge to eat.  Feeling mixed up (confused).  Being tired  and grouchy (irritable).  A fever.  Watery poop (diarrhea). How is this treated? This condition may be treated with:  Antibiotic medicine.  Other medicines.  Drinking enough water. Follow these instructions at home:  Medicines  Take over-the-counter and prescription medicines only as told by your doctor.  If you were prescribed an antibiotic medicine, take it as told by your doctor. Do not stop taking it even if you start to feel better. General instructions  Make sure you: ? Pee until your bladder is empty. ? Do not hold pee for a long time. ? Empty your bladder after sex. ? Wipe from front to back after pooping if you are a female. Use each tissue one time when you wipe.  Drink enough fluid to keep your pee pale yellow.  Keep all follow-up visits as told by your doctor. This is important. Contact a doctor if:  You do not get better after 1-2 days.  Your symptoms go away and then come back. Get help right away if:  You have very bad back pain.  You have very bad pain in your lower belly.  You have a fever.  You are sick to your stomach (nauseous).  You are throwing up. Summary  A urinary tract infection (UTI) is an infection of any part of the urinary tract.  This condition is caused by germs in your genital area.  There are many risk factors for a UTI. These include having a small, thin   tube to drain pee and not being able to control when you pee or poop.  Treatment includes antibiotic medicines for germs.  Drink enough fluid to keep your pee pale yellow. This information is not intended to replace advice given to you by your health care provider. Make sure you discuss any questions you have with your health care provider. Document Released: 05/21/2008 Document Revised: 06/12/2018 Document Reviewed: 06/12/2018 Elsevier Interactive Patient Education  2019 Elsevier Inc.  

## 2019-01-11 LAB — URINE CULTURE

## 2019-01-12 LAB — CYTOLOGY - PAP
DIAGNOSIS: NEGATIVE
HPV (WINDOPATH): NOT DETECTED

## 2019-01-20 ENCOUNTER — Other Ambulatory Visit (INDEPENDENT_AMBULATORY_CARE_PROVIDER_SITE_OTHER): Payer: 59

## 2019-01-20 ENCOUNTER — Other Ambulatory Visit: Payer: Self-pay | Admitting: Certified Nurse Midwife

## 2019-01-20 ENCOUNTER — Telehealth: Payer: Self-pay | Admitting: Emergency Medicine

## 2019-01-20 DIAGNOSIS — Z113 Encounter for screening for infections with a predominantly sexual mode of transmission: Secondary | ICD-10-CM

## 2019-01-20 DIAGNOSIS — N912 Amenorrhea, unspecified: Secondary | ICD-10-CM

## 2019-01-20 DIAGNOSIS — Z0000 Encounter for general adult medical examination without abnormal findings: Secondary | ICD-10-CM

## 2019-01-20 NOTE — Telephone Encounter (Signed)
Patient in today for blood work.   Requests to have CMP and check for hep B and C since other STD's have been drawn.   Lorre Nick has drawn extra blood in case it was appropriate to add on testing.

## 2019-01-21 ENCOUNTER — Telehealth: Payer: Self-pay | Admitting: *Deleted

## 2019-01-21 LAB — COMPREHENSIVE METABOLIC PANEL
ALT: 30 IU/L (ref 0–32)
AST: 25 IU/L (ref 0–40)
Albumin/Globulin Ratio: 1.4 (ref 1.2–2.2)
Albumin: 4.1 g/dL (ref 3.8–4.8)
Alkaline Phosphatase: 80 IU/L (ref 39–117)
BUN/Creatinine Ratio: 10 (ref 9–23)
BUN: 7 mg/dL (ref 6–20)
Bilirubin Total: 0.2 mg/dL (ref 0.0–1.2)
CO2: 21 mmol/L (ref 20–29)
Calcium: 8.8 mg/dL (ref 8.7–10.2)
Chloride: 104 mmol/L (ref 96–106)
Creatinine, Ser: 0.73 mg/dL (ref 0.57–1.00)
GFR calc Af Amer: 123 mL/min/{1.73_m2} (ref >59)
GFR calc non Af Amer: 107 mL/min/{1.73_m2} (ref >59)
Globulin, Total: 2.9 g/dL (ref 1.5–4.5)
Glucose: 130 mg/dL — ABNORMAL HIGH (ref 65–99)
Potassium: 3.7 mmol/L (ref 3.5–5.2)
Sodium: 141 mmol/L (ref 134–144)
Total Protein: 7 g/dL (ref 6.0–8.5)

## 2019-01-21 LAB — HEPATITIS B SURFACE ANTIGEN: HEP B S AG: NEGATIVE

## 2019-01-21 LAB — TSH: TSH: 0.742 u[IU]/mL (ref 0.450–4.500)

## 2019-01-21 LAB — HEPATITIS C ANTIBODY: Hep C Virus Ab: <0.1 s/co ratio (ref 0.0–0.9)

## 2019-01-21 LAB — HCG, SERUM, QUALITATIVE: HCG, BETA SUBUNIT, QUAL, SERUM: NEGATIVE m[IU]/mL (ref <6)

## 2019-01-21 MED ORDER — MEDROXYPROGESTERONE ACETATE 10 MG PO TABS: 10.0000 mg | ORAL_TABLET | Freq: Every day | ORAL | 0 refills | Status: DC

## 2019-01-21 NOTE — Telephone Encounter (Signed)
-----   Message from Regina Eck, CNM sent at 01/21/2019 12:36 PM EST ----- Regarding: Amenorrhea with negative HCG Ok to do Provera 10 mg x 5 days patient needs to notify if bleeding or no bleeding up to two weeks after completes medication. If she wants to have normal cycles with PCOS would need to be back on OCP.

## 2019-01-21 NOTE — Telephone Encounter (Signed)
-----   Message from Regina Eck, CNM sent at 01/21/2019  7:59 AM EST ----- Notify patient her Liver, kidney profile was normal. Glucose was elevated at 130. She needs to follow up with her Glucose with her PCP. Per epic she has had elevation previously. Hep B and C are negative TSH is normal HCG is negative, if she has not been sexually active she can start her POP for contraception. Please advise order will need to be placed and she will need 3 month recheck after starting. Please review how to take again. Expectations with POP were reviewed at visit. Has she had period since OV ? We did not expect she would. She does not have protection with POP until one month use, needs to continue condom use

## 2019-01-21 NOTE — Telephone Encounter (Signed)
Spoke with patient, advised as seen below per Melvia Heaps, CNM. Rx for Provera to verified pharmacy. Patient declines OCP, desires pregnancy in future.  Patient verbalizes understanding, will return call to provide update or with amy additional questions/concenrs.   Routing to provider for final review. Patient is agreeable to disposition. Will close encounter.

## 2019-01-21 NOTE — Telephone Encounter (Signed)
Notes recorded by Burnice Logan, RN on 01/21/2019 at 9:55 AM EST Spoke with patient, advised as seen below per Melvia Heaps, CNM. Copy of labs dated 01/20/19 faxed via Epic to PCP, Arlan Organ, NP for f/u of glucose. No menses to date. Patient reports she has not been SA.  Patient declines POP, does not want to be on oral birth control pill, requesting alternative to start menses.  Advised I will review with Melvia Heaps, CNM and return call with recommendations.  Patient verbalizes understanding and is agreeable.  See telephone encounter dated 01/21/19 to review with provider.    Melvia Heaps, CNM -please review and advise.

## 2019-03-04 ENCOUNTER — Encounter: Payer: Self-pay | Admitting: Certified Nurse Midwife

## 2019-03-04 ENCOUNTER — Ambulatory Visit (INDEPENDENT_AMBULATORY_CARE_PROVIDER_SITE_OTHER): Payer: 59 | Admitting: Certified Nurse Midwife

## 2019-03-04 ENCOUNTER — Other Ambulatory Visit: Payer: Self-pay

## 2019-03-04 VITALS — BP 118/70 | Temp 98.4°F | Wt 222.0 lb

## 2019-03-04 DIAGNOSIS — Z3009 Encounter for other general counseling and advice on contraception: Secondary | ICD-10-CM

## 2019-03-04 DIAGNOSIS — R7309 Other abnormal glucose: Secondary | ICD-10-CM | POA: Diagnosis not present

## 2019-03-05 ENCOUNTER — Encounter: Payer: Self-pay | Admitting: Certified Nurse Midwife

## 2019-03-05 DIAGNOSIS — I10 Essential (primary) hypertension: Secondary | ICD-10-CM | POA: Insufficient documentation

## 2019-03-05 DIAGNOSIS — Z8659 Personal history of other mental and behavioral disorders: Secondary | ICD-10-CM | POA: Insufficient documentation

## 2019-03-05 DIAGNOSIS — F322 Major depressive disorder, single episode, severe without psychotic features: Secondary | ICD-10-CM | POA: Insufficient documentation

## 2019-03-05 DIAGNOSIS — E282 Polycystic ovarian syndrome: Secondary | ICD-10-CM | POA: Insufficient documentation

## 2019-03-05 LAB — HEMOGLOBIN A1C
ESTIMATED AVERAGE GLUCOSE: 105 mg/dL
Hgb A1c MFr Bld: 5.3 % (ref 4.8–5.6)

## 2019-03-05 NOTE — Progress Notes (Signed)
  Subjective:     Patient ID: Jamie Chung, female   DOB: [birth year], 38 y.o.   MRN: [id number]  HPI 38 yo Single African American female [place] here for consult only regarding contraception that she can use that she will have period every month, so she can try for pregnancy. Patient was initially seen as new patient on 01/06/2019 for aex. Had amenorrhea episode and was treated with Provera with withdrawal bleeding occurring on 02/16/2019 with normal period amount per patient. She had previously been diagnosed with PCOS and was on Metformin and other medication to help with control and mostly regular periods. She is smoker so would need to be on Progesterone only option. She does not want IUD or Depo Provera or POP. Stopped her previous medications when she no longer was in that practice. Was treated through [facility] in [place]. Would like to try for pregnancy soon. Not on prenatal vitamins. Not using contraception at present. She is seeing PCP for hypertension management and had elevated glucose with last visit here and was to follow up with PCP regarding, but did not make appointment. Aware this was important, copy of labs were sent there.. She is aware she would need to have changes with medication if becomes pregnant. No other concerns today.   Review of Systems  Constitutional: Negative.   HENT: Negative.   Eyes: Negative.   Respiratory: Negative.   Cardiovascular: Negative.   Gastrointestinal: Negative.   Endocrine: Negative.   Genitourinary: Negative.   Musculoskeletal: Negative.   Allergic/Immunologic: Negative.   Neurological: Negative.   Hematological: Negative.   Psychiatric/Behavioral: Negative.        Objective:   Physical Exam  Not done     Assessment:     History of PCOS with irregular periods and previous medication use for PCOS Hypertension on medication with PCP Desires contraception for regular periods Smoker needs progesterone only Elevated glucose on  labs at initial visit, no follow up with PCP    Plan:     Discussed due to history of PCOS this is the reason she has had irregular periods in past. Normal period recently and needs to be on contraception until plan made for pregnancy due to other medical issues. Patient will continue use condoms at this point.. Start on prenatal vitamins. Discussed needs follow up on elevated glucose, will draw Hgb A1-C today and may need to be on medication. Agreeable to lab Hgb A1-C. Will request previous records for documentation of PCOS and previous treatment plan. Patient agreeable with plan. Would go back on medications if needed now. Recommend consult for fertility with Dr. Sabra Chung once labs in due to [facility] and hypertension. Patient agreeable.. Questions addressed.  Rv as above prn  Time in face to face consult regarding PCOS, fertility and pregnancy   28 minutes

## 2019-03-20 ENCOUNTER — Telehealth: Payer: Self-pay | Admitting: Certified Nurse Midwife

## 2019-03-20 MED ORDER — MEDROXYPROGESTERONE ACETATE 5 MG PO TABS: ORAL_TABLET | ORAL | 0 refills | Status: DC

## 2019-03-20 NOTE — Telephone Encounter (Signed)
Spoke with patient. Patient states that she does not want to be on birth control. Rx for Provera 5 mg x 5 days every other month if no spontaneous cycle #30 0RF sent to pharmacy on file. Aware she will want to use condoms and have negative UPT before taking medication. Will need to track when she takes the medication and her cycles. Patient verbalizes understanding and will call with any questions or concerns. Encounter closed.

## 2019-03-20 NOTE — Telephone Encounter (Signed)
If she doesn't want contraception, please call in provera 5 mg x 5 days every other month if no spontaneous cycles. If she does this she needs to use condoms for 2 weeks and have a negative UPT prior to starting the provera. If she wants contraception, can start her on micronor (should do a provera w/d first)

## 2019-03-20 NOTE — Telephone Encounter (Signed)
Spoke with patient. Patient states that she has been having irregular cycles since January. Was previously on OCP, but discontinued. Patient is a smoker and has hypertension managed by PCP. H/O PCOS. Reports no menses in January or February. Took Provera 10 mg daily for 5 days. Cycle started March 2nd- March 8th and was a normal menses for patient. Patient is now 5 days late for menses. Took UPT which was negative. Requesting plan for next steps in care.

## 2019-03-20 NOTE — Telephone Encounter (Signed)
Patient has questions about her abnormal cycles, she states she has not had a cycle this month and would like to know what next steps are. States she should have started her cycle 5 days ago and has not started.

## 2019-03-25 ENCOUNTER — Telehealth: Payer: Self-pay | Admitting: Obstetrics and Gynecology

## 2019-03-25 NOTE — Telephone Encounter (Signed)
Please contact patient in follow up to visit with Evalee Mutton.   Patients records have been received from [facility] from her care there in 2015.   Patient was not make an appointment with Dr. Sabra Heck for fertility care. Please make an appointment for June.  I will place the records in her folder.

## 2019-03-26 NOTE — Telephone Encounter (Signed)
Spoke with patient. Message given as seen below from [place]. Patient verbalizes understanding. Appointment scheduled for 04/20/2019 at 2:30 pm with Dr.Thiam. Patient does not want to wait until June 2020. Aware due to COVID 10 state of emergency we are operating on different hours and only seeing emergent patients at this time. Advised her appointment scheduling may change if this has to be extended due to COVID 19. Patient verbalizes understanding. Encounter closed.

## 2019-03-26 NOTE — Telephone Encounter (Signed)
Left message to call [name] at [phone]. 

## 2019-04-15 ENCOUNTER — Telehealth: Payer: Self-pay

## 2019-04-15 ENCOUNTER — Encounter: Payer: Self-pay | Admitting: Obstetrics & Gynecology

## 2019-04-15 NOTE — Telephone Encounter (Addendum)
Spoke with patient. Appointment for fertility consultation changed to Webex on 04/16/2019 at 11 am with Dr.Reichardt. Patient is agreeable to date and time. Has access to video calling for this Webex. Webex instructions reviewed. Email on file verified. Fasting insulin level scheduled for 04/20/2019 at 9:30 am. Patient is agreeable to date and time.  Cc: Thayer Ohm  Routing to provider and will close encounter.

## 2019-04-16 ENCOUNTER — Other Ambulatory Visit: Payer: Self-pay

## 2019-04-16 ENCOUNTER — Ambulatory Visit (INDEPENDENT_AMBULATORY_CARE_PROVIDER_SITE_OTHER): Payer: 59 | Admitting: Obstetrics & Gynecology

## 2019-04-16 ENCOUNTER — Encounter: Payer: Self-pay | Admitting: Obstetrics & Gynecology

## 2019-04-16 ENCOUNTER — Telehealth: Payer: Self-pay | Admitting: Obstetrics & Gynecology

## 2019-04-16 DIAGNOSIS — N926 Irregular menstruation, unspecified: Secondary | ICD-10-CM | POA: Diagnosis not present

## 2019-04-16 DIAGNOSIS — Z3169 Encounter for other general counseling and advice on procreation: Secondary | ICD-10-CM

## 2019-04-16 MED ORDER — LETROZOLE 2.5 MG PO TABS: ORAL_TABLET | ORAL | 0 refills | Status: DC

## 2019-04-16 MED ORDER — MEDROXYPROGESTERONE ACETATE 5 MG PO TABS: 5.0000 mg | ORAL_TABLET | Freq: Every day | ORAL | 1 refills | Status: DC

## 2019-04-16 NOTE — Telephone Encounter (Signed)
Message   [employer] [facility],  Is it possible we can schedule our webex visit after 12pm tomorrow? They scheduled a last minute training at my job and i will be in training from 8am-12pm. Please contact me as soon as possible.   Thanks!

## 2019-04-16 NOTE — Telephone Encounter (Signed)
Reviewed with Dr.Krummel. Call to patient. Webex appointment moved to 04/16/2019 at 12 pm. Patient is agreeable to date and time. Patient will have access to video call at that time.  Routing to provider and will close encounter.

## 2019-04-16 NOTE — Progress Notes (Signed)
Virtual Visit via Video Note  I connected with Jamie. Chung on 04/16/19 at 12:00 PM EDT by a video enabled telemedicine application and verified that I am speaking with the correct person using two identifiers.  Location: Patient: home Provider: office   I discussed the limitations of evaluation and management by telemedicine and the availability of in person appointments. The patient expressed understanding and agreed to proceed.  History of Present Illness: 51 yrs  G2P0 African American Single female G2P0020 here for discussion of desires to be pregnant.  Together with significant other for 18.  She stopped OCPs last year in December.  She's had 3 cycles.  She's used provera x 2 for cycles during this time.  She is not taking a PNV.  He has fathered a child.  This was three years ago.  She is not taking PNV and was advised to start today.  She has a remote hx of chlamydia (>10 years ago) and hx of gonorrhea >1 year ago.  Was treated for both.  Has hx of normal ultrasound about 3-4 years ago with [facility].  I cannot see this in Epic.  Medications: Sertraline.  Reviewed UTD data about safety in pregnancy and no association with birth defects. HCTZ:  Considered second line agent for treatment of hypertension in pregnancy Olmesartan:  Will need to be discontinued.  This is typically not taking when attempting pregnancy and must be stopped as soon as there is a pregnancy.  PMed hx:  Hypertension, PCOS PSurg Hx: tonsillectomy and adenoidectomy Smoker: yes, trying to quit  Significant other PMed Hx: neg Significant other PSurg hx: neg Significant other smokes: No  D/w pt components of fertility evaluation including testing for ovulation, semen analysis testing, and evaluation of cavity of uterus and for tubal patency.  Speed of this evaluation and proceeding with fertility medication will depend on desires and pt and her significant other.  Also, due to age, feel should test for  ovarian reserve as well.  This was communicated to her in Chat feature on Webex for her to have and review.  Questions invited and answered.  Specific instructions regarding each part of evaluation were given to patient.   Observations/Objective: WNWD AAF NAD  Assessment and Plan: Secondary Infertility PCOS Hypertension Oligo-ovulation   Follow Up Instructions: 1.  Will start Femara with next cycle.  She will call with onset of cycle.  Femara 5mg  days 5-9 of cycle will be given.  She will need a progesterone level drawn on day #23.  If needs to trigger cycle with Provera, she will take UPT if not cycle by day 30.  If negative, she will take Provera 5mg  for 10 days.  Rx to pharmacy for both of these.  2.  Will test AMH at same time this lab is drawn.  Future lab order placed.    3.  Rubella testing will be done same day as well.  Future lab order placed.     4.  HIGHLY, recommended starting PNV or 161WRU folic acid daily ASAP.  5.  Semen analysis recommended.  Pt will pick up information when comes for blood work.  6.  Release of ultrasound records from [facility] recommended.  She will also need [facility].  7.  Medications reviewed.  She knows she should stop the Olmesartan (or at least as soon as has UPT).  Recommended reaching out to PCP to considering changing medication at this time.  States she will review with PCP.  8.  She will continue the Glucophage at 500mg  daily.  Does not need rx for this.  9.  Smoking cessation highly encouraged.   I discussed the assessment and treatment plan with the patient. The patient was provided an opportunity to ask questions and all were answered. The patient agreed with the plan and demonstrated an understanding of the instructions.   The patient was advised to call back or seek an in-person evaluation if the symptoms worsen or if the condition fails to improve as anticipated.  I provided 45 minutes of non-face-to-face time during this  encounter.   Megan Salon, MD

## 2019-04-17 ENCOUNTER — Encounter: Payer: Self-pay | Admitting: Obstetrics & Gynecology

## 2019-04-17 ENCOUNTER — Telehealth: Payer: Self-pay | Admitting: Obstetrics & Gynecology

## 2019-04-17 NOTE — Telephone Encounter (Signed)
If she goes longer than 90 days between cycles, then she should use provera.  Does not have to use it every month.  Should do UPT if cycle is late, however, due to the [place] antihypertensive she is taking.  She and I discussed this yesterday.

## 2019-04-17 NOTE — Telephone Encounter (Signed)
Spoke with patient. Patient states that after her visit she has decided to hold off on trying for pregnancy at this time. Would like to get her health under better control before trying to conceive. Asking if she will needs to take Provera to start her cycles as she has not been cycling regularly for the last few months. Advised will review with Dr.Hubner and return call.

## 2019-04-17 NOTE — Telephone Encounter (Signed)
Message   Can you Have Dr Sabra Heck to call me today if she's available anytime today?

## 2019-04-20 ENCOUNTER — Other Ambulatory Visit: Payer: 59

## 2019-04-20 ENCOUNTER — Ambulatory Visit: Payer: 59 | Admitting: Obstetrics & Gynecology

## 2019-04-20 NOTE — Telephone Encounter (Signed)
Spoke with patient, advised as seen below per Dr. Sabra Heck. Patient states she was scheduled today for labs, but was waiting for a response, is lab work needed at this time since she is going to hold off on conceiving? Advised I will review with Dr. Sabra Heck and return call, patient agreeable.   Dr. Sabra Heck -please advise on progesterone, AMH, and rubella labs.

## 2019-04-21 NOTE — Telephone Encounter (Signed)
Spoke with patient, advised as seen below per Dr. Sabra Heck. Patient request to proceed with labs, lab appt scheduled for 5/20 at 9:45am.  Routing to provider for final review. Patient is agreeable to disposition. Will close encounter.

## 2019-04-21 NOTE — Telephone Encounter (Signed)
The lab work is helpful for when she decides to start trying and it could make her move more quickly, depending on the results.  Ultimately, it is up to her about whether to have blood work now or wait.  Future orders have been placed.

## 2019-05-04 ENCOUNTER — Other Ambulatory Visit: Payer: Self-pay

## 2019-05-05 ENCOUNTER — Encounter: Payer: Self-pay | Admitting: Obstetrics & Gynecology

## 2019-05-06 ENCOUNTER — Other Ambulatory Visit: Payer: Self-pay

## 2019-05-12 ENCOUNTER — Encounter: Payer: Self-pay | Admitting: Obstetrics & Gynecology

## 2019-05-13 ENCOUNTER — Other Ambulatory Visit: Payer: 59

## 2019-05-18 ENCOUNTER — Other Ambulatory Visit: Payer: Self-pay

## 2019-05-20 ENCOUNTER — Encounter: Payer: Self-pay | Admitting: Obstetrics & Gynecology

## 2019-05-20 ENCOUNTER — Other Ambulatory Visit: Payer: 59

## 2019-11-08 ENCOUNTER — Telehealth: Payer: 59

## 2019-11-08 ENCOUNTER — Encounter: Payer: Self-pay | Admitting: Emergency Medicine

## 2019-11-08 ENCOUNTER — Other Ambulatory Visit: Payer: Self-pay

## 2019-11-08 ENCOUNTER — Ambulatory Visit
Admission: EM | Admit: 2019-11-08 | Discharge: 2019-11-08 | Disposition: A | Discharge status: Home / Self Care | Payer: 59 | Attending: Emergency Medicine | Admitting: Emergency Medicine

## 2019-11-08 DIAGNOSIS — Z20828 Contact with and (suspected) exposure to other viral communicable diseases: Secondary | ICD-10-CM | POA: Diagnosis not present

## 2019-11-08 DIAGNOSIS — J069 Acute upper respiratory infection, unspecified: Secondary | ICD-10-CM | POA: Diagnosis not present

## 2019-11-08 DIAGNOSIS — Z20822 Contact with and (suspected) exposure to covid-19: Secondary | ICD-10-CM

## 2019-11-08 MED ORDER — PREDNISONE 20 MG PO TABS: 40.0000 mg | ORAL_TABLET | Freq: Every day | ORAL | 0 refills | Status: AC

## 2019-11-08 MED ORDER — BENZONATATE 100 MG PO CAPS: 100.0000 mg | ORAL_CAPSULE | Freq: Three times a day (TID) | ORAL | 0 refills | Status: DC

## 2019-11-08 MED ORDER — ALBUTEROL SULFATE HFA 108 (90 BASE) MCG/ACT IN AERS: 2.0000 | INHALATION_SPRAY | RESPIRATORY_TRACT | 0 refills | Status: AC | PRN

## 2019-11-08 NOTE — ED Notes (Signed)
Patient able to ambulate independently  

## 2019-11-08 NOTE — ED Provider Notes (Signed)
[facility]    CSN: [id number] Arrival date & time: 11/08/19  1301      History   Chief Complaint Chief Complaint  Patient presents with   Cough   Chest Pain   Back Pain    HPI Jamie Chung is a 38 y.o. female with history of hypertension, allergies presenting for costochondral pain, bilateral thoracic back pain, cough, nasal congestion since last Thursday.  States she underwent drive-through Covid testing yesterday: Test pending.  Has been managing symptoms at home with OTC/home remedies with minimal relief.  Patient denies loss of taste, smell.  Patient has had increased dyspnea: Denies wheezing, chest pain, palpitations, lower extremity edema.  Patient denies recent change in medication, history of blood clot.     Past Medical History:  Diagnosis Date   Allergy    Anemia    Hypertension    Medical history non-contributory    Migraines    PCOS (polycystic ovarian syndrome)    STD (sexually transmitted disease)    trichomonas    Patient Active Problem List   Diagnosis Date Noted   Current severe episode of major depressive disorder without psychotic features without prior episode ([place]) 03/05/2019   Essential hypertension 03/05/2019   PCOS (polycystic ovarian syndrome) 03/05/2019   Dysfunctional uterine bleeding 01/06/2019    Past Surgical History:  Procedure Laterality Date   LUMBAR PUNCTURE     TONSILLECTOMY      OB History    Gravida  2   Para  0   Term      Preterm      AB  2   Living  0     SAB  1   TAB  1   Ectopic      Multiple      Live Births               Home Medications    Prior to Admission medications   Medication Sig Start Date End Date Taking? Authorizing Provider  albuterol (VENTOLIN HFA) 108 (90 Base) MCG/ACT inhaler Inhale 2 puffs into the lungs every 4 (four) hours as needed for wheezing or shortness of breath. 11/08/19   Hall-Potvin, [place], PA-C  benzonatate (TESSALON) 100 MG  capsule Take 1 capsule (100 mg total) by mouth every 8 (eight) hours. 11/08/19   Hall-Potvin, [place], PA-C  hydrochlorothiazide (HYDRODIURIL) 25 MG tablet  12/18/18   [provider]  hydrOXYzine (ATARAX/VISTARIL) 25 MG tablet Take 1 tablet (25 mg total) by mouth every 6 (six) hours as needed for itching. 12/07/17   Bjorn Pippin, PA-C  ibuprofen (ADVIL,MOTRIN) 800 MG tablet TK 1 T PO TID FOR 10 DAYS 12/18/18   [provider]  letrozole [facility]) 2.5 MG tablet Take 2 tablets (5mg ) days 5-9 of cycle 04/16/19   Megan Salon, MD  medroxyPROGESTERone (PROVERA) 5 MG tablet Take 1 tablet (5 mg total) by mouth daily. Take for 10 days to start cycle. 04/16/19   Megan Salon, MD  metFORMIN (GLUCOPHAGE) 500 MG tablet Take 500 mg by mouth daily. 04/06/19   [provider]  olmesartan (BENICAR) 20 MG tablet Take 20 mg by mouth daily. 03/26/19   [provider]  Omega-3 Fatty Acids (FISH OIL PO) Take by mouth.    [provider]  predniSONE (DELTASONE) 20 MG tablet Take 2 tablets (40 mg total) by mouth daily for 7 days. 11/08/19 11/15/19  Hall-Potvin, [place], PA-C  sertraline (ZOLOFT) 25 MG tablet Take 25 mg  by mouth daily. 03/26/19   [provider]  UNABLE TO FIND Joint mobility    [provider]    Family History Family History  Problem Relation Age of Onset   Hypertension Mother    Hypertension Maternal Grandmother    Heart attack Maternal Grandfather    Breast cancer Paternal Grandmother     Social History Social History   Tobacco Use   Smoking status: Current Some Day Smoker    Packs/day: 0.25    Years: 4.00    Pack years: 1.00    Types: Cigarettes   Smokeless tobacco: Current User  Substance Use Topics   Alcohol use: Yes    Alcohol/week: 3.0 standard drinks    Types: 3 Standard drinks or equivalent per week   Drug use: No     Allergies   Lisinopril and Mobic [meloxicam]   Review of Systems Review of  Systems  Constitutional: Negative for activity change, appetite change, fatigue and fever.  HENT: Positive for congestion. Negative for dental problem, ear pain, facial swelling, hearing loss, sinus pain, sore throat, trouble swallowing and voice change.   Eyes: Negative for photophobia, pain and visual disturbance.  Respiratory: Positive for cough. Negative for chest tightness, shortness of breath and wheezing.   Cardiovascular: Positive for chest pain. Negative for palpitations.  Gastrointestinal: Negative for blood in stool, diarrhea, nausea and vomiting.  Musculoskeletal: Negative for arthralgias and myalgias.  Neurological: Negative for dizziness and headaches.     Physical Exam Triage Vital Signs ED Triage Vitals [11/08/19 1314]  Enc Vitals Group     BP (!) 143/98     Pulse Rate 99     Resp 18     Temp 97.8 F (36.6 C)     Temp Source Temporal     SpO2 94 %     Weight      Height      Head Circumference      Peak Flow      Pain Score 10     Pain Loc      Pain Edu?      Excl. in [place]?    No data found.  Updated Vital Signs BP (!) 143/98 (BP Location: Left Arm)    Pulse 99    Temp 97.8 F (36.6 C) (Temporal)    Resp 18    SpO2 94%   Visual Acuity Right Eye Distance:   Left Eye Distance:   Bilateral Distance:    Right Eye Near:   Left Eye Near:    Bilateral Near:     Physical Exam Constitutional:      General: She is not in acute distress.    Appearance: She is well-developed. She is obese. She is not ill-appearing.  HENT:     Head: Normocephalic and atraumatic.  Eyes:     General: No scleral icterus.    Pupils: Pupils are equal, round, and reactive to light.  Neck:     Musculoskeletal: Neck supple.     Vascular: No JVD.     Trachea: No tracheal deviation.  Cardiovascular:     Rate and Rhythm: Normal rate and regular rhythm.     Heart sounds: Normal heart sounds.  Pulmonary:     Effort: Pulmonary effort is normal. No tachypnea, accessory muscle usage  or respiratory distress.     Breath sounds: No stridor. Wheezing and rhonchi present. No decreased breath sounds or rales.     Comments: Adventitious sounds diffuse, bilateral Chest:  Chest wall: Tenderness present. No mass or crepitus.     Comments: Patient tender over lower costochondral margins bilaterally Musculoskeletal: Normal range of motion.     Right lower leg: She exhibits no tenderness. No edema.     Left lower leg: She exhibits no tenderness. No edema.  Lymphadenopathy:     Cervical: No cervical adenopathy.  Skin:    General: Skin is warm.     Capillary Refill: Capillary refill takes less than 2 seconds.     Coloration: Skin is not cyanotic, jaundiced or pale.     Nails: There is no clubbing.   Neurological:     General: No focal deficit present.     Mental Status: She is alert and oriented to person, place, and time.      UC Treatments / Results  Labs (all labs ordered are listed, but only abnormal results are displayed) Labs Reviewed  NOVEL CORONAVIRUS, NAA    EKG   Radiology No results found.  Procedures Procedures (including critical care time)  Medications Ordered in UC Medications - No data to display  Initial Impression / Assessment and Plan / UC Course  I have reviewed the triage vital signs and the nursing notes.  Pertinent labs & imaging results that were available during my care of the patient were reviewed by me and considered in my medical decision making (see chart for details).     Patient without history of cardiopulmonary disease: States she had EKG done last year without abnormality.  Unable to review Covid testing as this was done at outside facility.  Patient requesting work note for quarantine as this facility did not provide one.  This provider feels more comfortable repeating test so that we have access to results: Patient agreeable to repeat test.  PCR done in office: Pending.  Patient to continue quarantine.  Patient  hemodynamically stable, afebrile with oxygen between 92-95% during visit.  Will give albuterol inhaler, prednisone, benzonatate, which patient has tolerated well in the past, for symptom management.  Return precautions discussed, patient verbalized understanding and is agreeable to plan. Final Clinical Impressions(s) / UC Diagnoses   Final diagnoses:  Viral URI with cough  Suspected COVID-19 virus infection     Discharge Instructions     Your COVID test is pending - it is important to quarantine / isolate at home until your results are back. If you test positive and would like further evaluation for persistent or worsening symptoms, you may schedule an E-visit or virtual (video) visit throughout the [facility] app or website.  PLEASE NOTE: If you develop severe chest pain or shortness of breath please go to the ER or call 9-1-1 for further evaluation --> DO NOT schedule electronic or virtual visits for this. Please call our office for further guidance / recommendations as needed.    ED Prescriptions    Medication Sig Dispense Auth. Provider   albuterol (VENTOLIN HFA) 108 (90 Base) MCG/ACT inhaler Inhale 2 puffs into the lungs every 4 (four) hours as needed for wheezing or shortness of breath. 18 g Hall-Potvin, [place], PA-C   predniSONE (DELTASONE) 20 MG tablet Take 2 tablets (40 mg total) by mouth daily for 7 days. 14 tablet Hall-Potvin, [place], PA-C   benzonatate (TESSALON) 100 MG capsule Take 1 capsule (100 mg total) by mouth every 8 (eight) hours. 21 capsule Hall-Potvin, [place], PA-C     PDMP not reviewed this encounter.   Hall-Potvin, [place], [place] 11/08/19 1423

## 2019-11-08 NOTE — ED Triage Notes (Signed)
Pt presents to [facility] for assessment of chest pain and back pain beginning after a cough started on Thursday.  Patient denies fevers.  C/o nasal congestion and sore throat.  Denies n/v/d'.

## 2019-11-08 NOTE — Discharge Instructions (Addendum)
Your COVID test is pending - it is important to quarantine / isolate at home until your results are back. °If you test positive and would like further evaluation for persistent or worsening symptoms, you may schedule an E-visit or virtual (video) visit throughout the [facility] MyChart app or website. ° °PLEASE NOTE: If you develop severe chest pain or shortness of breath please go to the ER or call 9-1-1 for further evaluation --> DO NOT schedule electronic or virtual visits for this. °Please call our office for further guidance / recommendations as needed. °

## 2019-11-09 LAB — NOVEL CORONAVIRUS, NAA: SARS-CoV-2, NAA: NOT DETECTED

## 2019-12-10 ENCOUNTER — Encounter: Payer: Self-pay | Admitting: Obstetrics & Gynecology

## 2019-12-10 DIAGNOSIS — N858 Other specified noninflammatory disorders of uterus: Secondary | ICD-10-CM

## 2019-12-10 DIAGNOSIS — D649 Anemia, unspecified: Secondary | ICD-10-CM

## 2019-12-10 DIAGNOSIS — Z8742 Personal history of other diseases of the female genital tract: Secondary | ICD-10-CM

## 2019-12-14 ENCOUNTER — Emergency Department (HOSPITAL_COMMUNITY): Payer: 59

## 2019-12-14 ENCOUNTER — Telehealth: Payer: Self-pay | Admitting: Certified Nurse Midwife

## 2019-12-14 ENCOUNTER — Other Ambulatory Visit: Payer: Self-pay

## 2019-12-14 ENCOUNTER — Telehealth: Payer: Self-pay | Admitting: Obstetrics & Gynecology

## 2019-12-14 ENCOUNTER — Emergency Department (HOSPITAL_COMMUNITY)
Admission: EM | Admit: 2019-12-14 | Discharge: 2019-12-14 | Disposition: A | Discharge status: Home / Self Care | Payer: 59 | Attending: Emergency Medicine | Admitting: Emergency Medicine

## 2019-12-14 DIAGNOSIS — F17210 Nicotine dependence, cigarettes, uncomplicated: Secondary | ICD-10-CM | POA: Diagnosis not present

## 2019-12-14 DIAGNOSIS — I10 Essential (primary) hypertension: Secondary | ICD-10-CM | POA: Diagnosis not present

## 2019-12-14 DIAGNOSIS — R102 Pelvic and perineal pain: Secondary | ICD-10-CM

## 2019-12-14 DIAGNOSIS — Z79899 Other long term (current) drug therapy: Secondary | ICD-10-CM | POA: Diagnosis not present

## 2019-12-14 DIAGNOSIS — R52 Pain, unspecified: Secondary | ICD-10-CM

## 2019-12-14 DIAGNOSIS — R9389 Abnormal findings on diagnostic imaging of other specified body structures: Secondary | ICD-10-CM

## 2019-12-14 DIAGNOSIS — R109 Unspecified abdominal pain: Secondary | ICD-10-CM | POA: Diagnosis present

## 2019-12-14 LAB — COMPREHENSIVE METABOLIC PANEL
ALT: 17 U/L (ref 0–44)
AST: 27 U/L (ref 15–41)
Albumin: 4.2 g/dL (ref 3.5–5.0)
Alkaline Phosphatase: 58 U/L (ref 38–126)
Anion gap: 13 (ref 5–15)
BUN: 13 mg/dL (ref 6–20)
CO2: 22 mmol/L (ref 22–32)
Calcium: 9 mg/dL (ref 8.9–10.3)
Chloride: 104 mmol/L (ref 98–111)
Creatinine, Ser: 0.97 mg/dL (ref 0.44–1.00)
GFR calc Af Amer: >60 mL/min (ref >60)
GFR calc non Af Amer: >60 mL/min (ref >60)
Glucose, Bld: 109 mg/dL — ABNORMAL HIGH (ref 70–99)
Potassium: 3.5 mmol/L (ref 3.5–5.1)
Sodium: 139 mmol/L (ref 135–145)
Total Bilirubin: 0.6 mg/dL (ref 0.3–1.2)
Total Protein: 7.7 g/dL (ref 6.5–8.1)

## 2019-12-14 LAB — WET PREP, GENITAL
Clue Cells Wet Prep HPF POC: NONE SEEN
Sperm: NONE SEEN
Trich, Wet Prep: NONE SEEN
WBC, Wet Prep HPF POC: NONE SEEN
Yeast Wet Prep HPF POC: NONE SEEN

## 2019-12-14 LAB — CBC
HCT: 34.3 % — ABNORMAL LOW (ref 36.0–46.0)
Hemoglobin: 11.2 g/dL — ABNORMAL LOW (ref 12.0–15.0)
MCH: 30.3 pg (ref 26.0–34.0)
MCHC: 32.7 g/dL (ref 30.0–36.0)
MCV: 92.7 fL (ref 80.0–100.0)
Platelets: 416 10*3/uL — ABNORMAL HIGH (ref 150–400)
RBC: 3.7 MIL/uL — ABNORMAL LOW (ref 3.87–5.11)
RDW: 13.4 % (ref 11.5–15.5)
WBC: 8.4 10*3/uL (ref 4.0–10.5)
nRBC: 0 % (ref 0.0–0.2)

## 2019-12-14 LAB — LIPASE, BLOOD: Lipase: 24 U/L (ref 11–51)

## 2019-12-14 LAB — I-STAT BETA HCG BLOOD, ED (MC, WL, AP ONLY): I-stat hCG, quantitative: <5 m[IU]/mL (ref <5)

## 2019-12-14 MED ORDER — KETOROLAC TROMETHAMINE 30 MG/ML IJ SOLN
30.0000 mg | Freq: Once | INTRAMUSCULAR | Status: AC
  Administered 2019-12-14: 30 mg via INTRAVENOUS
  Filled 2019-12-14: qty 1

## 2019-12-14 MED ORDER — SODIUM CHLORIDE 0.9% FLUSH
3.0000 mL | Freq: Once | INTRAVENOUS | Status: AC
  Administered 2019-12-14: 3 mL via INTRAVENOUS

## 2019-12-14 MED ORDER — HYDROCODONE-ACETAMINOPHEN 5-325 MG PO TABS: 1.0000 | ORAL_TABLET | ORAL | 0 refills | Status: DC | PRN

## 2019-12-14 MED ORDER — HYDROMORPHONE HCL 1 MG/ML IJ SOLN
0.5000 mg | Freq: Once | INTRAMUSCULAR | Status: AC
  Administered 2019-12-14: 0.5 mg via INTRAVENOUS
  Filled 2019-12-14: qty 1

## 2019-12-14 MED ORDER — NORETHINDRONE ACETATE 5 MG PO TABS: ORAL_TABLET | ORAL | 0 refills | Status: DC

## 2019-12-14 NOTE — Telephone Encounter (Signed)
Spoke with patient. Patient reports bleeding has reduced since telephone call this morning (see previous encounter dated 12/14/19) . Reports she has not had to change her pad since 8am. Pain unchanged, abd/pelvic 10/10. Patient states she has not gone to the ER for evaluation, requesting OV. Advised patient ER recommended for further evaluation of pain. We do not have imaging in office today, if imaging is needed it can be done while in ER. Advised patient I will review with Dr. Talbert Nan, patient placed on brief hold.    Call reviewed with Dr. Talbert Nan -ER recommended for further evaluation of pain 10/10. Advised patient of Dr. Gentry Fitz recommendations. Patient verbalizes understanding.   Routing to provider for final review. Patient is agreeable to disposition. Will close encounter.   Cc: Melvia Heaps, CNM, Dr. Sabra Heck

## 2019-12-14 NOTE — Addendum Note (Signed)
Addended by: Burnice Logan on: 12/14/2019 03:15 PM   Modules accepted: Orders

## 2019-12-14 NOTE — Telephone Encounter (Signed)
Spoke with patient. Reports menses started 3 wks ago, menses was initially "extremely heavy" for 2 wks then started to taper down. Started taking birth control about 2 wks ago to stop bleeding, states this was prescribed previously by Dr. Sabra Heck, patient unsure of name of Rx at this time. Woke up this morning to heavy bleeding, changing saturated tampon q30 min, dizziness and passed one blood clot the size of her palm. Reports abdominal/pelvic pain 10/10. Last visit was a MyChart visit on 04/16/19 for contraceptive counseling. Last AEX 01/06/19 with Melvia Heaps, CNM.   Patient placed on brief hold, reviewed with covering provider, Dr. Talbert Nan.   Spoke with patient, advised to go directly to ER now for further evaluation. Advised patient if experiencing dizziness do not drive. Patient verbalizes understanding and is agreeable.   Routing to provider for final review. Patient is agreeable to disposition. Will close encounter.  Cc: Dr. Sabra Heck, Melvia Heaps, CNM

## 2019-12-14 NOTE — Telephone Encounter (Signed)
Spoke with patient, she is walking out of ER now. Patient states she has not been SA in at least 3 wks, beta hcg in ER neg today. Advised patient to continue to abstain from intercourse, may need EMB. Advised patient to take Motrin 800 mg with food and water one hour before procedure. Patient states she last took OCP on 12/27. Patient reports vaginal bleeding was heavy again in the ER, gushing when she stood up. Patient asking if anything can be sent in for bleeding in the meantime? Patient request to schedule OV with Dr. Sabra Heck for f/u. OV scheduled for 12/29 at 4:30pm with Dr. Sabra Heck. Advised I will review with provider and return call. Pharmacy confirmed.   Dr. Sabra Heck -please review and advise on Rx if appropriate.   Cc: Dr. Talbert Nan

## 2019-12-14 NOTE — ED Notes (Signed)
US at bedside

## 2019-12-14 NOTE — Telephone Encounter (Signed)
I spoke with Marcene Brawn, PA in the ER. The patient is stable, not hemorrhaging. Pain resolved with toradol and dilaudid. Hgb 11.2 gm/dl. She has a very thickened endometrium on ultrasound and needs further GYN evaluation. Please schedule her to be seen in the next 1-2 days.

## 2019-12-14 NOTE — ED Provider Notes (Addendum)
[facility] DEPT Provider Note   CSN: [id number] Arrival date & time: 12/14/19  1100     History Chief Complaint  Patient presents with  . Abdominal Cramping  . Abdominal Pain    Jamie Chung is a 38 y.o. female.  The history is provided by the patient. No language interpreter was used.  Abdominal Cramping This is a new problem. The current episode started yesterday. The problem occurs constantly. The problem has been gradually worsening. Associated symptoms include abdominal pain. Nothing aggravates the symptoms.  Abdominal Pain Pt reports she has had abnormal periods and abnormal cramping. Pt has been treated with provera and OCP's. Pt reports she had severe cramps and passed a large clot today.  Pt called her gyn who advised her to come here.     Past Medical History:  Diagnosis Date  . Allergy   . Anemia   . Hypertension   . Medical history non-contributory   . Migraines   . PCOS (polycystic ovarian syndrome)   . STD (sexually transmitted disease)    trichomonas    Patient Active Problem List   Diagnosis Date Noted  . Current severe episode of major depressive disorder without psychotic features without prior episode ([place]) 03/05/2019  . Essential hypertension 03/05/2019  . PCOS (polycystic ovarian syndrome) 03/05/2019  . Dysfunctional uterine bleeding 01/06/2019    Past Surgical History:  Procedure Laterality Date  . LUMBAR PUNCTURE    . TONSILLECTOMY       OB History    Gravida  2   Para  0   Term      Preterm      AB  2   Living  0     SAB  1   TAB  1   Ectopic      Multiple      Live Births              Family History  Problem Relation Age of Onset  . Hypertension Mother   . Hypertension Maternal Grandmother   . Heart attack Maternal Grandfather   . Breast cancer Paternal Grandmother     Social History   Tobacco Use  . Smoking status: Current Some Day Smoker    Packs/day: 0.25   Years: 4.00    Pack years: 1.00    Types: Cigarettes  . Smokeless tobacco: Current User  Substance Use Topics  . Alcohol use: Yes    Alcohol/week: 3.0 standard drinks    Types: 3 Standard drinks or equivalent per week  . Drug use: No    Home Medications Prior to Admission medications   Medication Sig Start Date End Date Taking? Authorizing Provider  albuterol (VENTOLIN HFA) 108 (90 Base) MCG/ACT inhaler Inhale 2 puffs into the lungs every 4 (four) hours as needed for wheezing or shortness of breath. 11/08/19  Yes Hall-Potvin, [place], PA-C  hydrochlorothiazide (HYDRODIURIL) 25 MG tablet Take 25 mg by mouth daily.  12/18/18  Yes [provider]  medroxyPROGESTERone (PROVERA) 5 MG tablet Take 1 tablet (5 mg total) by mouth daily. Take for 10 days to start cycle. 04/16/19  Yes Megan Salon, MD  olmesartan (BENICAR) 20 MG tablet Take 20 mg by mouth daily. 03/26/19  Yes [provider]  sertraline (ZOLOFT) 25 MG tablet Take 25 mg by mouth daily. 03/26/19  Yes [provider]  benzonatate (TESSALON) 100 MG capsule Take 1 capsule (100 mg total) by mouth every 8 (eight) hours. Patient not taking:  Reported on 12/14/2019 11/08/19   Hall-Potvin, [place], PA-C  hydrOXYzine (ATARAX/VISTARIL) 25 MG tablet Take 1 tablet (25 mg total) by mouth every 6 (six) hours as needed for itching. Patient not taking: Reported on 12/14/2019 12/07/17   Bjorn Pippin, PA-C  letrozole [facility]) 2.5 MG tablet Take 2 tablets (5mg ) days 5-9 of cycle Patient not taking: Reported on 12/14/2019 04/16/19   Megan Salon, MD    Allergies    Lisinopril and Mobic [meloxicam]  Review of Systems   Review of Systems  Gastrointestinal: Positive for abdominal pain.  All other systems reviewed and are negative.   Physical Exam Updated Vital Signs BP (!) 132/110   Pulse 99   Temp 98 F (36.7 C) (Oral)   Resp 18   LMP 11/17/2019   SpO2 100%   Physical Exam Constitutional:      Appearance:  She is well-developed.  HENT:     Head: Normocephalic and atraumatic.  Eyes:     Conjunctiva/sclera: Conjunctivae normal.     Pupils: Pupils are equal, round, and reactive to light.  Cardiovascular:     Rate and Rhythm: Normal rate.     Heart sounds: Normal heart sounds.  Abdominal:     General: Bowel sounds are normal.     Palpations: Abdomen is soft.     Tenderness: There is no abdominal tenderness.  Genitourinary:    Vagina: Bleeding present.     Cervix: Normal.     Adnexa: Right adnexa normal and left adnexa normal.     Rectum: Normal.     Comments: Vaginal discharge,  Thick white,  Adnexa no masses,  Cervix nontender Musculoskeletal:        General: Normal range of motion.     Cervical back: Normal range of motion and neck supple.  Skin:    General: Skin is warm.     ED Results / Procedures / Treatments   Labs (all labs ordered are listed, but only abnormal results are displayed) Labs Reviewed  COMPREHENSIVE METABOLIC PANEL - Abnormal; Notable for the following components:      Result Value   Glucose, Bld 109 (*)    All other components within normal limits  CBC - Abnormal; Notable for the following components:   RBC 3.70 (*)    Hemoglobin 11.2 (*)    HCT 34.3 (*)    Platelets 416 (*)    All other components within normal limits  WET PREP, GENITAL  LIPASE, BLOOD  I-STAT BETA HCG BLOOD, ED ([facility], [facility], [facility] ONLY)  GC/CHLAMYDIA PROBE AMP ([place]) NOT AT [facility]    EKG None  Radiology US PELVIC COMPLETE W TRANSVAGINAL AND TORSION R/O  Result Date: 12/14/2019 CLINICAL DATA:  Mid pelvic pain EXAM: TRANSABDOMINAL AND TRANSVAGINAL ULTRASOUND OF PELVIS DOPPLER ULTRASOUND OF OVARIES TECHNIQUE: Both transabdominal and transvaginal ultrasound examinations of the pelvis were performed. Transabdominal technique was performed for global imaging of the pelvis including uterus, ovaries, adnexal regions, and pelvic cul-de-sac. It was necessary to proceed with endovaginal exam  following the transabdominal exam to visualize the endometrium. Color and duplex Doppler ultrasound was utilized to evaluate blood flow to the ovaries. COMPARISON:  11/15/2008 FINDINGS: Uterus Measurements: 8.9 x 5.0 x 6.0 cm = volume: 139 mL. No fibroids or other mass visualized. Small nabothian cysts at the level of the cervix. Endometrium Thickness: 4.1 cm. Endometrium is heterogeneous with increased vascularity. Right ovary Measurements: 2.9 x 2.1 x 2.1 cm = volume: 7 mL. Normal appearance/no adnexal mass.  Left ovary Measurements: 3.0 x 1.5 x 2.3 cm = volume: 6 mL. Normal appearance/no adnexal mass. Pulsed Doppler evaluation of both ovaries demonstrates normal low-resistance arterial and venous waveforms. Other findings No abnormal free fluid. IMPRESSION: 1. Heterogeneously thickened endometrium measuring approximately 4.1 cm with increased vascularity. Findings may reflect diffuse adenomyosis versus endometrial hyperplasia or a large submucosal fibroid. Endometrial neoplasm is not excluded. Further evaluation with gynecologic consultation and pelvic MRI are recommended. 2. Unremarkable appearance of the ovaries. No evidence for adnexal torsion. Electronically Signed   By: Davina Poke D.O.   On: 12/14/2019 13:31    Procedures Procedures (including critical care time)  Medications Ordered in ED Medications  sodium chloride flush (NS) 0.9 % injection 3 mL (3 mLs Intravenous Given 12/14/19 1156)  ketorolac (TORADOL) 30 MG/ML injection 30 mg (30 mg Intravenous Given 12/14/19 1156)  HYDROmorphone (DILAUDID) injection 0.5 mg (0.5 mg Intravenous Given 12/14/19 1303)    ED Course  I have reviewed the triage vital signs and the nursing notes.  Pertinent labs & imaging results that were available during my care of the patient were reviewed by me and considered in my medical decision making (see chart for details).    MDM Rules/Calculators/A&P                      MDM  Hemoglobin 11.2    Ultrasound shows endometrial thickening. Pt counseled on results and need to see Gynecologist for further evaluation,    I spoke with the patient's gynecologist and discussed the results of her ultrasound.  Dr. Sabra Heck advised she will have her office call the patient to schedule appointment for follow-up and biopsy.  She was able to review the results of the ultrasound during her conversation. Final Clinical Impression(s) / ED Diagnoses Final diagnoses:  Pain  Pelvic pain in female  Endometrial thickening on ultrasound    Rx / DC Orders ED Discharge Orders         Ordered    HYDROcodone-acetaminophen (NORCO/VICODIN) 5-325 MG tablet  Every 4 hours PRN     12/14/19 1415        An After Visit Summary was printed and given to the patient.    Fransico Meadow, PA-C 12/14/19 [place], [place] 12/14/19 1438    Hayden Rasmussen, MD 12/14/19 [phone]

## 2019-12-14 NOTE — Discharge Instructions (Signed)
See your gynecologist for evaluation.  You may need further imagining and possible biopsy

## 2019-12-14 NOTE — Telephone Encounter (Signed)
Reviewed with Dr. Sabra Heck, call returned to patient. Rx for norethindrone 5 mg tabs, take 10 mg PO bid until bleeding stops and then daily. Dispense 45, 0RF.  Advised patient to start medication when she picks it up and take second dose later tonight. Patient verbalizes understanding and is agreeable.

## 2019-12-14 NOTE — ED Triage Notes (Signed)
Patient reports she has been on her cycle x1 month. Patient reports she had some random BCP at her house and tried taking those to stop the bleeding. Patient reports she passed a blood clot the size of her palm and called her PCP and was told to come to the ED for an ultrasound.

## 2019-12-14 NOTE — Telephone Encounter (Signed)
Patient is calling regarding "painful bleeding." Patient stated that she passed a blood clot "the size of the palm of her hand." Call transferred to Lawson Radar, RN.

## 2019-12-14 NOTE — ED Notes (Signed)
Patient is undressing at this time. Will start IV once patient is in a gown

## 2019-12-14 NOTE — Telephone Encounter (Signed)
Patient is asking to talk with Sharee Pimple again after conversation this morning.

## 2019-12-14 NOTE — Telephone Encounter (Signed)
Patient sent the following correspondence through [place].  Im having issues with my cycle. I have been bleeding for a month now.

## 2019-12-15 ENCOUNTER — Ambulatory Visit: Payer: 59 | Admitting: Obstetrics & Gynecology

## 2019-12-15 ENCOUNTER — Encounter: Payer: Self-pay | Admitting: Obstetrics & Gynecology

## 2019-12-15 LAB — GC/CHLAMYDIA PROBE AMP (~~LOC~~) NOT AT ARMC
Chlamydia: NEGATIVE
Neisseria Gonorrhea: NEGATIVE

## 2019-12-15 NOTE — Progress Notes (Deleted)
GYNECOLOGY  VISIT  CC:   ***  HPI: 38 y.o. G68P0020 Single Black or African American female here for ER follow up.  GYNECOLOGIC HISTORY: Patient's last menstrual period was 11/17/2019. Contraception: *** Menopausal hormone therapy: none  Patient Active Problem List   Diagnosis Date Noted  . Current severe episode of major depressive disorder without psychotic features without prior episode ([place]) 03/05/2019  . Essential hypertension 03/05/2019  . PCOS (polycystic ovarian syndrome) 03/05/2019  . Dysfunctional uterine bleeding 01/06/2019    Past Medical History:  Diagnosis Date  . Allergy   . Anemia   . Hypertension   . Medical history non-contributory   . Migraines   . PCOS (polycystic ovarian syndrome)   . STD (sexually transmitted disease)    trichomonas    Past Surgical History:  Procedure Laterality Date  . LUMBAR PUNCTURE    . TONSILLECTOMY      MEDS:   Current Outpatient Medications on File Prior to Visit  Medication Sig Dispense Refill  . albuterol (VENTOLIN HFA) 108 (90 Base) MCG/ACT inhaler Inhale 2 puffs into the lungs every 4 (four) hours as needed for wheezing or shortness of breath. 18 g 0  . benzonatate (TESSALON) 100 MG capsule Take 1 capsule (100 mg total) by mouth every 8 (eight) hours. (Patient not taking: Reported on 12/14/2019) 21 capsule 0  . hydrochlorothiazide (HYDRODIURIL) 25 MG tablet Take 25 mg by mouth daily.     Marland Kitchen HYDROcodone-acetaminophen (NORCO/VICODIN) 5-325 MG tablet Take 1 tablet by mouth every 4 (four) hours as needed for moderate pain. 12 tablet 0  . hydrOXYzine (ATARAX/VISTARIL) 25 MG tablet Take 1 tablet (25 mg total) by mouth every 6 (six) hours as needed for itching. (Patient not taking: Reported on 12/14/2019) 12 tablet 0  . letrozole (FEMARA) 2.5 MG tablet Take 2 tablets (5mg ) days 5-9 of cycle (Patient not taking: Reported on 12/14/2019) 10 tablet 0  . medroxyPROGESTERone (PROVERA) 5 MG tablet Take 1 tablet (5 mg total) by mouth  daily. Take for 10 days to start cycle. 10 tablet 1  . norethindrone (AYGESTIN) 5 MG tablet Take 10 mg (2 tabs) PO bid until bleeding stops, then 10 mg daily. 45 tablet 0  . olmesartan (BENICAR) 20 MG tablet Take 20 mg by mouth daily.    . sertraline (ZOLOFT) 25 MG tablet Take 25 mg by mouth daily.     No current facility-administered medications on file prior to visit.    ALLERGIES: Lisinopril and Mobic [meloxicam]  Family History  Problem Relation Age of Onset  . Hypertension Mother   . Hypertension Maternal Grandmother   . Heart attack Maternal Grandfather   . Breast cancer Paternal Grandmother     SH:  ***  Review of Systems  PHYSICAL EXAMINATION:    LMP 11/17/2019     General appearance: alert, cooperative and appears stated age Neck: no adenopathy, supple, symmetrical, trachea midline and thyroid {CHL AMB PHY EX THYROID NORM DEFAULT:[phone]::"normal to inspection and palpation"} CV:  {Exam; heart brief:31539} Lungs:  {pe lungs ob:314451::"clear to auscultation, no wheezes, rales or rhonchi, symmetric air entry"} Breasts: {Exam; breast:13139::"normal appearance, no masses or tenderness"} Abdomen: soft, non-tender; bowel sounds normal; no masses,  no organomegaly Lymph:  no inguinal LAD noted  Pelvic: External genitalia:  no lesions              Urethra:  normal appearing urethra with no masses, tenderness or lesions  Bartholins and Skenes: normal                 Vagina: normal appearing vagina with normal color and discharge, no lesions              Cervix: {CHL AMB PHY EX CERVIX NORM DEFAULT:[phone]::"no lesions"}              Bimanual Exam:  Uterus:  {CHL AMB PHY EX UTERUS NORM DEFAULT:[phone]::"normal size, contour, position, consistency, mobility, non-tender"}              Adnexa: {CHL AMB PHY EX ADNEXA NO MASS DEFAULT:[phone]::"no mass, fullness, tenderness"}              Rectovaginal: {yes no:314532}.  Confirms.              Anus:  normal  sphincter tone, no lesions  Chaperone, ***Terence Lux, CMA, was present for exam.  Assessment: ***  Plan: ***   ~{NUMBERS; -10-45 JOINT ROM:10287} minutes spent with patient >50% of time was in face to face discussion of above.

## 2019-12-17 ENCOUNTER — Ambulatory Visit (INDEPENDENT_AMBULATORY_CARE_PROVIDER_SITE_OTHER): Payer: 59

## 2019-12-17 ENCOUNTER — Other Ambulatory Visit: Payer: Self-pay

## 2019-12-17 ENCOUNTER — Encounter: Payer: Self-pay | Admitting: Obstetrics & Gynecology

## 2019-12-17 ENCOUNTER — Ambulatory Visit (INDEPENDENT_AMBULATORY_CARE_PROVIDER_SITE_OTHER): Payer: 59 | Admitting: Obstetrics & Gynecology

## 2019-12-17 VITALS — BP 122/74 | HR 103 | Temp 97.8°F | Ht 67.5 in | Wt 224.0 lb

## 2019-12-17 DIAGNOSIS — N920 Excessive and frequent menstruation with regular cycle: Secondary | ICD-10-CM | POA: Diagnosis not present

## 2019-12-17 DIAGNOSIS — N926 Irregular menstruation, unspecified: Secondary | ICD-10-CM

## 2019-12-17 DIAGNOSIS — D251 Intramural leiomyoma of uterus: Secondary | ICD-10-CM | POA: Diagnosis not present

## 2019-12-17 DIAGNOSIS — D649 Anemia, unspecified: Secondary | ICD-10-CM

## 2019-12-17 NOTE — Progress Notes (Deleted)
GYNECOLOGY  VISIT  CC:   Vaginal bleeding   HPI: 38 y.o. G78P0020 Single Black or African American female here for vaginal bleeding she states that it is moderate today.   GYNECOLOGIC HISTORY: Patient's last menstrual period was 11/17/2019. Contraception: no Menopausal hormone therapy: none  Patient Active Problem List   Diagnosis Date Noted  . Current severe episode of major depressive disorder without psychotic features without prior episode ([place]) 03/05/2019  . Essential hypertension 03/05/2019  . PCOS (polycystic ovarian syndrome) 03/05/2019  . Dysfunctional uterine bleeding 01/06/2019    Past Medical History:  Diagnosis Date  . Allergy   . Anemia   . Hypertension   . Medical history non-contributory   . Migraines   . PCOS (polycystic ovarian syndrome)   . STD (sexually transmitted disease)    trichomonas    Past Surgical History:  Procedure Laterality Date  . LUMBAR PUNCTURE    . TONSILLECTOMY      MEDS:   Current Outpatient Medications on File Prior to Visit  Medication Sig Dispense Refill  . albuterol (VENTOLIN HFA) 108 (90 Base) MCG/ACT inhaler Inhale 2 puffs into the lungs every 4 (four) hours as needed for wheezing or shortness of breath. 18 g 0  . benzonatate (TESSALON) 100 MG capsule Take 1 capsule (100 mg total) by mouth every 8 (eight) hours. 21 capsule 0  . hydrochlorothiazide (HYDRODIURIL) 25 MG tablet Take 25 mg by mouth daily.     Marland Kitchen HYDROcodone-acetaminophen (NORCO/VICODIN) 5-325 MG tablet Take 1 tablet by mouth every 4 (four) hours as needed for moderate pain. 12 tablet 0  . hydrOXYzine (ATARAX/VISTARIL) 25 MG tablet Take 1 tablet (25 mg total) by mouth every 6 (six) hours as needed for itching. 12 tablet 0  . letrozole (FEMARA) 2.5 MG tablet Take 2 tablets (5mg ) days 5-9 of cycle 10 tablet 0  . medroxyPROGESTERone (PROVERA) 5 MG tablet Take 1 tablet (5 mg total) by mouth daily. Take for 10 days to start cycle. 10 tablet 1  . norethindrone (AYGESTIN) 5  MG tablet Take 10 mg (2 tabs) PO bid until bleeding stops, then 10 mg daily. 45 tablet 0  . olmesartan (BENICAR) 20 MG tablet Take 20 mg by mouth daily.    . sertraline (ZOLOFT) 25 MG tablet Take 25 mg by mouth daily.     No current facility-administered medications on file prior to visit.    ALLERGIES: Lisinopril and Mobic [meloxicam]  Family History  Problem Relation Age of Onset  . Hypertension Mother   . Hypertension Maternal Grandmother   . Heart attack Maternal Grandfather   . Breast cancer Paternal Grandmother     SH:  ***  Review of Systems  Genitourinary: Positive for vaginal bleeding.    PHYSICAL EXAMINATION:    BP 122/74   Pulse (!) 103   Temp 97.8 F (36.6 C)   Ht 5' 7.5" (1.715 m)   Wt 224 lb (101.6 kg)   LMP 11/17/2019   SpO2 96%   BMI 34.57 kg/m     General appearance: alert, cooperative and appears stated age Neck: no adenopathy, supple, symmetrical, trachea midline and thyroid {CHL AMB PHY EX THYROID NORM [phone]::"normal to inspection and palpation"} CV:  {Exam; heart brief:31539} Lungs:  {pe lungs ob:314451::"clear to auscultation, no wheezes, rales or rhonchi, symmetric air entry"} Breasts: {Exam; breast:13139::"normal appearance, no masses or tenderness"} Abdomen: soft, non-tender; bowel sounds normal; no masses,  no organomegaly Lymph:  no inguinal LAD noted  Pelvic: External genitalia:  no  lesions              Urethra:  normal appearing urethra with no masses, tenderness or lesions              Bartholins and Skenes: normal                 Vagina: normal appearing vagina with normal color and discharge, no lesions              Cervix: {CHL AMB PHY EX CERVIX NORM [phone]::"no lesions"}              Bimanual Exam:  Uterus:  {CHL AMB PHY EX UTERUS NORM DEFAULT:[phone]::"normal size, contour, position, consistency, mobility, non-tender"}              Adnexa: {CHL AMB PHY EX ADNEXA NO MASS DEFAULT:[phone]::"no mass,  fullness, tenderness"}              Rectovaginal: {yes no:314532}.  Confirms.              Anus:  normal sphincter tone, no lesions  Chaperone, ***Terence Lux, CMA, was present for exam.  Assessment: ***  Plan: ***   ~{NUMBERS; -10-45 JOINT ROM:10287} minutes spent with patient >50% of time was in face to face discussion of above.

## 2019-12-17 NOTE — Progress Notes (Signed)
38 y.o. G38P0020 Single Black or [place] American female here for pelvic ultrasound.  Pt was seen in ER on 12/14/2019 due to menorrhagia.  Pt's hb was 11.2.  PUS showed a 4.1cm endometrial thickness with vascularity.  I reviewed images and did not feel this was correct measurement and that she either has fibroids or adenomyosis.  Pt reports she has passed some clots but that her bleeding is much better today.  She is taking norethindrone.  She does have pregnancy desires and stopped OCPs about three months ago.  Pregnancy testing in ER was negative.  Patient's last menstrual period was 11/17/2019.  Contraception: none  Findings:  UTERUS:  8.6 x 5.8 x 4.9cm with 4.4 x 4.6cm fibroid that abuts the endometrium.  This does not appear to invade the cavity. EMS: 3.11mm ADNEXA: Left ovary: 2.2 x 1.9 x 1.4cm       Right ovary: 2.6 x 1.5 x 1.5cm CUL DE SAC: no free fluid  Discussion:  Reviewed with pt results and that distinct fibroid edges are not noted today on ultrasound.  However, her endometrium is NOT 4.1cm in thickness and she does not need additional endometrial evaluation at this time.  However, pelvic MRI would be helpful to determine if this is a single fibroid or cluter of fibroids vs adenomyosis.  There is not streaking or myometrial lacunae/cysts noted today so that is less likely.  Given amount of bleeding and pregnancy desires, she would likely benefit from myomectomy as well.  Pt comfortable with plan.  Questions answered..  Assessment:  Menorrhagia likely from 4.6cm fibroid  Plan:  Pelvic MRI will be scheduled.  She will continue the BID aygestin until bleeding has stopped and will then transition to daily.  She knows not to stop this until advised.  ~25 minutes spent with patient >50% of time was in face to face discussion of above.

## 2019-12-21 NOTE — Telephone Encounter (Signed)
-----   Message from Megan Salon, MD sent at 12/17/2019  5:32 PM EST ----- Regarding: pelvic MRI Pt has posterior uterine mass that looks most like a fibroid but ultrasound was not definitive of this diagnosis.  She is having menorrhagia and mild anemia as well.  Needs pelvic MRI for additional evaluation.  Please schedule.  Thanks.

## 2019-12-21 NOTE — Telephone Encounter (Signed)
Left message for pt to call back to triage to talk with Colletta Maryland, RN

## 2019-12-23 ENCOUNTER — Emergency Department (HOSPITAL_COMMUNITY)
Admission: EM | Admit: 2019-12-23 | Discharge: 2019-12-23 | Disposition: A | Discharge status: Home / Self Care | Payer: 59

## 2019-12-23 ENCOUNTER — Other Ambulatory Visit: Payer: Self-pay

## 2019-12-29 ENCOUNTER — Other Ambulatory Visit: Payer: Self-pay | Admitting: Obstetrics & Gynecology

## 2019-12-29 ENCOUNTER — Telehealth: Payer: Self-pay | Admitting: Obstetrics & Gynecology

## 2019-12-29 DIAGNOSIS — D649 Anemia, unspecified: Secondary | ICD-10-CM

## 2019-12-29 DIAGNOSIS — N858 Other specified noninflammatory disorders of uterus: Secondary | ICD-10-CM

## 2019-12-29 DIAGNOSIS — Z8742 Personal history of other diseases of the female genital tract: Secondary | ICD-10-CM

## 2019-12-29 NOTE — Telephone Encounter (Addendum)
Call reviewed with Dr. Sabra Heck, call returned to patient.   Confirmed patient is taking Aygestin 10mg  bid.  Advised will change to Aygestin 5mg  tid.  OTC Ibuprofen 400mg  and Tylenol 500 mg q4h prn for pain.  Will work on moving up MRI of pelvis.   Patient will need refill of Aygestin, pharmacy confirmed. Has 15 tablets left.   Advised patient I will review refill request with Dr. Sabra Heck and return call. Will also work on rescheduling MRI and provide update. Patient agreeable.   Dr. Sabra Heck -please advise on refill. Rx pended.

## 2019-12-29 NOTE — Telephone Encounter (Signed)
Spoke with patient. Patient was seen in office on 12/17/19 for ER f/u, PUS completed. MRI of pelvis is scheduled for 01/15/20. She stopped bleeding for 1 day approximately 7 days ago, reduced aygestin from bid to daily for 1 day. Bleeding restarted, she increased back to bid. Reports flow is not heavy,  passing several "palm sized" clots over the past few days. Pelvic pain 10/10, no longer relieved by OTC motrin. Had a Rx for vicodin from tooth extraction, tried this, no relief. Denies SOB, weakness, fatigue, lightheadedness or dizziness. Advised patient OV needed for further evaluation, will review scheduling with Dr. Sabra Heck and return call. Patient agreeable.

## 2019-12-29 NOTE — Telephone Encounter (Signed)
Spoke with Manus Gunning at [facility]. Was advised no earlier MRI appts.   Call placed to [facility] main scheduling, spoke with Vivien Rota. Patient scheduled for MRI pelvis w/wo contrast at [facility] on 01/01/20 at 7pm, arrive at 6:30pm. No restrictions.   Call placed to patient. Advised of appt as seen above. Patient will contact [facility] directly to cancel her appt on 1/29. Advised our office will precert. Will notify patient if any RX changes once reviewed by Dr. Sabra Heck. Patient thankful for f/u and verbalizes understanding.   Routing to [employer] and [employer]  for [employer].

## 2019-12-29 NOTE — Telephone Encounter (Signed)
Patient is in pain and passing blood clots.

## 2020-01-01 ENCOUNTER — Other Ambulatory Visit: Payer: Self-pay

## 2020-01-01 ENCOUNTER — Ambulatory Visit (HOSPITAL_COMMUNITY)
Admission: RE | Admit: 2020-01-01 | Discharge: 2020-01-01 | Disposition: A | Discharge status: Home / Self Care | Payer: 59 | Source: Ambulatory Visit | Attending: Obstetrics & Gynecology | Admitting: Obstetrics & Gynecology

## 2020-01-01 DIAGNOSIS — N858 Other specified noninflammatory disorders of uterus: Secondary | ICD-10-CM | POA: Diagnosis not present

## 2020-01-01 DIAGNOSIS — Z8742 Personal history of other diseases of the female genital tract: Secondary | ICD-10-CM

## 2020-01-01 DIAGNOSIS — D649 Anemia, unspecified: Secondary | ICD-10-CM | POA: Insufficient documentation

## 2020-01-01 MED ORDER — GADOBUTROL 1 MMOL/ML IV SOLN
10.0000 mL | Freq: Once | INTRAVENOUS | Status: AC | PRN
  Administered 2020-01-01: 10 mL via INTRAVENOUS

## 2020-01-03 ENCOUNTER — Other Ambulatory Visit: Payer: Self-pay | Admitting: Obstetrics & Gynecology

## 2020-01-03 ENCOUNTER — Encounter: Payer: Self-pay | Admitting: Obstetrics & Gynecology

## 2020-01-03 MED ORDER — NORETHINDRONE ACETATE 5 MG PO TABS: 5.0000 mg | ORAL_TABLET | Freq: Three times a day (TID) | ORAL | 0 refills | Status: DC

## 2020-01-04 ENCOUNTER — Telehealth: Payer: Self-pay | Admitting: *Deleted

## 2020-01-04 NOTE — Telephone Encounter (Signed)
Patient returned call

## 2020-01-04 NOTE — Telephone Encounter (Signed)
-----   Message from Megan Salon, MD sent at 01/03/2020  6:40 AM EST ----- Please let pt know that her MRI showed a 5.4cm area of adenomyosis and not a fibroid.  This is not treated surgically but is treated medically.  If she wants to move ahead with getting pregnant, then she really needs to do that because she is going to continue to have heavy menstrual and monthly bleeding.  If she is not sure about pregnancy, she either needs and IUD or to be back on her OCPs ASAP to help control her bleeding.  This condition is usually treated with IUD or some other hormonal medication for treatment or hysterectomy when the individual is finally tired of the bleeding.  I did the MRI to be sure about this vs a fibroid (which can be resected surgically).  I am still happy to send her to Dr. Kerin Perna for a consultation if she desires but he will not surgically remove this.  Lastly, if has lots of questions, just scheduled OV or telehealth visit.  Thanks.

## 2020-01-04 NOTE — Telephone Encounter (Signed)
Spoke with patient, advised of results as seen below per Dr. Sabra Heck. Patient declines IUD or OCP at this time, request MyChart visit to further discuss with Dr. Sabra Heck. MyChart visit scheduled for 1/21 at 11:30am. Patient verbalizes understanding and is agreeable.   Routing to provider for final review. Patient is agreeable to disposition. Will close encounter.

## 2020-01-04 NOTE — Telephone Encounter (Signed)
Burnice Logan, RN  01/04/2020 10:22 AM EST    Left message to call Sharee Pimple, RN at [facility].   Patient removed from IMG hold.    Megan Salon, MD  01/03/2020 6:40 AM EST    Out of imaging holds. Thanks.

## 2020-01-07 ENCOUNTER — Encounter: Payer: Self-pay | Admitting: Obstetrics & Gynecology

## 2020-01-07 ENCOUNTER — Telehealth (INDEPENDENT_AMBULATORY_CARE_PROVIDER_SITE_OTHER): Payer: 59 | Admitting: Obstetrics & Gynecology

## 2020-01-07 DIAGNOSIS — R102 Pelvic and perineal pain unspecified side: Secondary | ICD-10-CM

## 2020-01-07 DIAGNOSIS — N926 Irregular menstruation, unspecified: Secondary | ICD-10-CM

## 2020-01-07 DIAGNOSIS — E282 Polycystic ovarian syndrome: Secondary | ICD-10-CM | POA: Diagnosis not present

## 2020-01-07 DIAGNOSIS — D369 Benign neoplasm, unspecified site: Secondary | ICD-10-CM

## 2020-01-07 DIAGNOSIS — Z0184 Encounter for antibody response examination: Secondary | ICD-10-CM

## 2020-01-07 NOTE — Progress Notes (Signed)
Virtual Visit via Telephone Note  I connected with Jamie Chung on 01/07/20 at 11:30 AM EST by telephone and verified that I am speaking with the correct person using two identifiers.  Location: Patient: home Provider: office   I discussed the limitations, risks, security and privacy concerns of performing an evaluation and management service by telephone and the availability of in person appointments. I also discussed with the patient that there may be a patient responsible charge related to this service. The patient expressed understanding and agreed to proceed.   History of Present Illness: 38 you G2P0020 SAAF for discussion of recent MRI.  Pt was seen in ER on 12/14/2019 due to menorrhagia and dysmenorrhea.  Ultrasound was performed showing 4cm endometrium.  She was brought into the office and ultrasound repeated.  Fibroid vs adenomyoma noted.  Pelvic MRI recommended to assist treatment planning.  MRI completed on 01/01/2020 and mass effect measuring 5.4 x 3.8 x 5.2cm that is an endometrioma.  Reviewed with pt that surgical excision is not performed for adenomyosis.    She has been interested in pregnancy but has not really made any steps toward pregnancy with plan we established with web based visit on 04/16/2019.    Observations/Objective: WNWD AAF NAD  Assessment and Plan: 5cm adenomyoma Pelvic pain PCOS Smoker--cessation has been highly encouraged H/O chlamydia >10 years ago and gonorrhea about 2 years ago  Follow Up Instructions: Continue Metformin Return for AMH and rubella testing Will refer to Dr. Yalcinkaya as I feel this pt needs tubal assessment, possible laparoscopy in pt with large adenomyoma and pelvic pain.   Recommended starting PNV Semen analysis also discussed, again.  Order faxed to Dr. Yalcinkaya.  Semen analysis kit with instructions left at front office for pt to pick up.  I discussed the assessment and treatment plan with the patient. The patient was  provided an opportunity to ask questions and all were answered. The patient agreed with the plan and demonstrated an understanding of the instructions.   The patient was advised to call back or seek an in-person evaluation if the symptoms worsen or if the condition fails to improve as anticipated.  I provided 30 minutes of non-face-to-face time during this encounter.   Mary S Calvario, MD   

## 2020-01-08 ENCOUNTER — Encounter: Payer: Self-pay | Admitting: Obstetrics & Gynecology

## 2020-01-08 ENCOUNTER — Telehealth: Payer: Self-pay | Admitting: Obstetrics & Gynecology

## 2020-01-08 DIAGNOSIS — D369 Benign neoplasm, unspecified site: Secondary | ICD-10-CM | POA: Insufficient documentation

## 2020-01-08 NOTE — Telephone Encounter (Signed)
Patient schedule AEX with Dr. Sabra Heck on 02/08/2020 at 11am. Encounter closed.

## 2020-01-08 NOTE — Telephone Encounter (Signed)
Call to patient, no answer, mailbox full.   Please assist with scheduling AEX if triage unavailable.

## 2020-01-08 NOTE — Telephone Encounter (Signed)
Patient sent the following correspondence through [employer].  I have an appointment on Feb 26th with Dr Hollice Espy. Is it possible that I can do the exam with you instead since you have been seeing me?

## 2020-01-12 ENCOUNTER — Ambulatory Visit: Payer: 59 | Admitting: Certified Nurse Midwife

## 2020-01-12 ENCOUNTER — Other Ambulatory Visit: Payer: Self-pay

## 2020-01-12 ENCOUNTER — Other Ambulatory Visit (INDEPENDENT_AMBULATORY_CARE_PROVIDER_SITE_OTHER): Payer: 59

## 2020-01-12 DIAGNOSIS — Z0184 Encounter for antibody response examination: Secondary | ICD-10-CM

## 2020-01-12 DIAGNOSIS — N926 Irregular menstruation, unspecified: Secondary | ICD-10-CM

## 2020-01-15 ENCOUNTER — Other Ambulatory Visit: Payer: 59

## 2020-01-17 LAB — RUBELLA SCREEN: Rubella Antibodies, IGG: 12.3 index (ref >0.99)

## 2020-01-17 LAB — ANTI MULLERIAN HORMONE: ANTI-MULLERIAN HORMONE (AMH): 9.52 ng/mL

## 2020-01-18 ENCOUNTER — Encounter: Payer: Self-pay | Admitting: Obstetrics & Gynecology

## 2020-01-18 ENCOUNTER — Telehealth: Payer: Self-pay | Admitting: Obstetrics & Gynecology

## 2020-01-18 MED ORDER — ORILISSA 200 MG PO TABS: 200.0000 mg | ORAL_TABLET | Freq: Two times a day (BID) | ORAL | 1 refills | Status: DC

## 2020-01-18 MED ORDER — METFORMIN HCL 500 MG PO TABS: 500.0000 mg | ORAL_TABLET | Freq: Every day | ORAL | 1 refills | Status: DC

## 2020-01-18 NOTE — Telephone Encounter (Signed)
Rx's completed.  Thanks.

## 2020-01-18 NOTE — Telephone Encounter (Addendum)
Spoke with patient, advised of results and recommendations as seen below.   1. Patient does not have current RX for Glucophage 500 mg po daily. Patient states she stopped taking medication about 2 months ago. Reports increased smell from vagina while taking, this resolved after stopping medication. If no alternative, will restart medication, request refill to pharmacy on file.   2. Patient agreeable to proceed with referral to Dr. Kerin Perna. Referral placed on 01/08/20. Patient is aware she will be notified of appt once scheduled.   3. Started folic acid, not PNV. Plans to start PNV, has not purchased yet.   4. Patient agreeable to proceed with Rx for Orilissa. Advised patient medication may require PA, our office will complete this if needed. Also discussed manufacture savings card, if needed.   Rx pended for Orilissa and Glucophage.   Routing to Dr. Sabra Heck to review and advise.   Cc: Magdalene Patricia

## 2020-01-18 NOTE — Telephone Encounter (Signed)
-----   Message from Megan Salon, MD sent at 01/18/2020 10:25 AM EST ----- Please let pt know that her AMH is elevated which is consistent with polycystic ovarian syndrome.  She needs to be taking her Glucophage every single day as this helps with more regular ovulation and pregnancy.    Her rubella is immune so she does not need to be revaccinated.    Did she start the folic acid and PNV?   I did get copies of ultrasounds/endometrial biopsy from [facility] but it was from 2015.  So the study on the cavity of her uterus will need to be repeated but this can be done with Dr. Kerin Perna.    I did talk to Dr. Kerin Perna and he recommended Freida Busman 200mg  BID which should help suppress her adenomyosis and help pain.  I can order this if she wants or we can discuss if she desires.  Thanks.

## 2020-01-18 NOTE — Telephone Encounter (Signed)
Patient sent the following correspondence through [place].  Has [facility] faxed over my medical yet?

## 2020-01-19 ENCOUNTER — Telehealth: Payer: Self-pay | Admitting: Obstetrics & Gynecology

## 2020-01-19 NOTE — Telephone Encounter (Signed)
Call to patient, left detailed message, ok per dpr. Advised that [facility] on [facility]. F/u with pharmacy for filling, return call to office if any additional questions or assistance needed.

## 2020-01-19 NOTE — Telephone Encounter (Signed)
Patient sent the following message through [facility]. Routing to triage to assist patient with request.  Gunnar Bulla "[name]"  P [facility] Pool  Phone Number: [phone]  Hi This message is for Sharee Pimple, the nurse. [facility] so [facility]. The one I normally use on [address] until i can [facility] out [facility]. Thank you. Sorry the inconvenience

## 2020-01-19 NOTE — Telephone Encounter (Signed)
Patient notified to call & have her rx transferred. Patient to call if any problems.

## 2020-01-26 ENCOUNTER — Telehealth: Payer: Self-pay | Admitting: Obstetrics & Gynecology

## 2020-01-26 NOTE — Telephone Encounter (Signed)
PA for Orilissa submitted to plan via [email or web address]   KeyJean Rosenthal - PA Case ID: [id number] - Rx #[id number]   Patient notified.

## 2020-01-26 NOTE — Telephone Encounter (Signed)
Maddy at [facility] regarding prior authorization for prescription for patient. States they sent it over yesterday via fax and via cover my meds. Call back number is [phone] reference # [id number]. Would like to confirm it has been received.

## 2020-01-28 NOTE — Telephone Encounter (Signed)
[facility] is calling for additional information for PA on Orilissa. [phone].

## 2020-01-28 NOTE — Telephone Encounter (Signed)
Call returned to [facility], Spoke with customer service rep "Thayer Headings". Was advised PA for Freida Busman denied. Requires Dx of endometriosis. Will receive fax with appeal information.   Dx submitted: pelvic pain, adenomyosis, menorrhagia, dysmenorrhea, endometrioma  To proceed with appeal will need:  1. Dx 2. Copy of denial letter 3. Tried and failed meds 3. Medical necessity Faxed to [facility] at [phone] [facility] as Urgent for response in 72 hours.   No fax recived to date.   Dr. Sabra Heck -do you want to proceed with appeal?

## 2020-02-05 ENCOUNTER — Other Ambulatory Visit: Payer: Self-pay

## 2020-02-05 NOTE — Telephone Encounter (Signed)
Routing to Dr. Sabra Heck to review and advise on Rx.

## 2020-02-05 NOTE — Telephone Encounter (Signed)
Yes please. I've answered the questions below.  Thanks.  To proceed with appeal will need:  1. Dx:  Adenomyosis, adenomyoma, pelvic pain 2. Copy of denial letter 3. Tried and failed meds:  Ibuprofen, percocet 4. Medical necessity:  Pt is being treated conservatively because she is desirous of pregnancy and has been referred to reproductive endocrinologist for recommendations.  Pt does not desire a hysterectomy for treatment.  Faxed to [facility] at [phone] [facility] as Urgent for response in 72 hours.

## 2020-02-05 NOTE — Telephone Encounter (Signed)
[facility] called regarding prescription for Orlissa. [phone]

## 2020-02-06 ENCOUNTER — Other Ambulatory Visit: Payer: Self-pay | Admitting: Obstetrics & Gynecology

## 2020-02-08 ENCOUNTER — Ambulatory Visit (INDEPENDENT_AMBULATORY_CARE_PROVIDER_SITE_OTHER): Payer: 59 | Admitting: Obstetrics & Gynecology

## 2020-02-08 ENCOUNTER — Encounter: Payer: Self-pay | Admitting: Obstetrics & Gynecology

## 2020-02-08 ENCOUNTER — Other Ambulatory Visit: Payer: Self-pay

## 2020-02-08 VITALS — BP 132/80 | HR 84 | Temp 97.5°F | Resp 12 | Ht 68.0 in | Wt 221.0 lb

## 2020-02-08 DIAGNOSIS — Z01419 Encounter for gynecological examination (general) (routine) without abnormal findings: Secondary | ICD-10-CM

## 2020-02-08 MED ORDER — METFORMIN HCL 500 MG PO TABS: 500.0000 mg | ORAL_TABLET | Freq: Every day | ORAL | 1 refills | Status: DC

## 2020-02-08 MED ORDER — SERTRALINE HCL 50 MG PO TABS: 50.0000 mg | ORAL_TABLET | Freq: Every day | ORAL | 1 refills | Status: DC

## 2020-02-08 NOTE — Progress Notes (Signed)
39 y.o. G50P0020 Single Black or [place] American female here for annual exam.  Jamie Chung has been approved, to the best of my knowledge.  Pt has not been able to get filled at pharmacy.  Advised I'm not sure why but my office will call.  Aygestin has really helped her bleeding.  Pain is better.  Has appt with Dr. Kerin Perna.  Has appt in May.  Reports had recent tooth infection.  Does need to have tooth pulled.  Has been on antibiotics.  It's a tooth in the front so she is planning on having an implant placed.    GC/Chl testing done in December in the ER.  Together with partner about 18 months.    Planning semen analysis later this week.  She is aware we need the release of records form completed for him.  Order has been faxed for this.  No LMP recorded. (Menstrual status: Oral contraceptives).          Sexually active: Yes.    The current method of family planning is none.    Exercising: No.  The patient does not participate in regular exercise at present. Smoker:  yes  Health Maintenance: Pap:  01/06/19 Neg:Neg HR HPV History of abnormal Pap:  no TDaP:  2010.  Aware this is due.  Declines today.   Pneumonia vaccine(s):  n/a Shingrix:   n/a Hep C testing: 01/20/19 Negative Screening Labs: discuss today if needed    reports that she has been smoking cigarettes. She has a 1.00 pack-year smoking history. She uses smokeless tobacco. She reports current alcohol use of about 3.0 standard drinks of alcohol per week. She reports that she does not use drugs.  Past Medical History:  Diagnosis Date  . Allergy   . Anemia   . Hypertension   . Medical history non-contributory   . Migraines   . PCOS (polycystic ovarian syndrome)   . STD (sexually transmitted disease)    trichomonas    Past Surgical History:  Procedure Laterality Date  . LUMBAR PUNCTURE    . TONSILLECTOMY      Current Outpatient Medications  Medication Sig Dispense Refill  . albuterol (VENTOLIN HFA) 108 (90 Base) MCG/ACT  inhaler Inhale 2 puffs into the lungs every 4 (four) hours as needed for wheezing or shortness of breath. 18 g 0  . benzonatate (TESSALON) 100 MG capsule Take 1 capsule (100 mg total) by mouth every 8 (eight) hours. 21 capsule 0  . hydrochlorothiazide (HYDRODIURIL) 25 MG tablet Take 25 mg by mouth daily.     Marland Kitchen HYDROcodone-acetaminophen (NORCO/VICODIN) 5-325 MG tablet Take 1 tablet by mouth every 4 (four) hours as needed for moderate pain. 12 tablet 0  . hydrOXYzine (ATARAX/VISTARIL) 25 MG tablet Take 1 tablet (25 mg total) by mouth every 6 (six) hours as needed for itching. 12 tablet 0  . metFORMIN (GLUCOPHAGE) 500 MG tablet Take 1 tablet (500 mg total) by mouth daily with breakfast. 90 tablet 1  . norethindrone (AYGESTIN) 5 MG tablet Take 1 tablet (5 mg total) by mouth 3 (three) times daily. 90 tablet 0  . sertraline (ZOLOFT) 50 MG tablet Take 1 tablet (50 mg total) by mouth daily. 30 tablet 1  . Elagolix Sodium (ORILISSA) 200 MG TABS Take 200 mg by mouth 2 (two) times daily. (Patient not taking: Reported on 02/08/2020) 60 tablet 1  . olmesartan (BENICAR) 20 MG tablet Take 20 mg by mouth daily.     No current facility-administered medications for this visit.  Family History  Problem Relation Age of Onset  . Hypertension Mother   . Hypertension Maternal Grandmother   . Heart attack Maternal Grandfather   . Breast cancer Paternal Grandmother     Review of Systems  All other systems reviewed and are negative.   Exam:   BP 132/80 (BP Location: Right Arm, Patient Position: Sitting, Cuff Size: Normal)   Pulse 84   Temp (!) 97.5 F (36.4 C) (Temporal)   Resp 12   Ht 5\' 8"  (1.727 m)   Wt 221 lb (100.2 kg)   BMI 33.60 kg/m   Height: 5\' 8"  (172.7 cm)  Ht Readings from Last 3 Encounters:  02/08/20 5\' 8"  (1.727 m)  12/17/19 5' 7.5" (1.715 m)  01/06/19 5' 7.5" (1.715 m)   General appearance: alert, cooperative and appears stated age Head: Normocephalic, without obvious abnormality,  atraumatic Neck: no adenopathy, supple, symmetrical, trachea midline and thyroid normal to inspection and palpation Lungs: clear to auscultation bilaterally Breasts: normal appearance, no masses or tenderness Heart: regular rate and rhythm Abdomen: soft, non-tender; bowel sounds normal; no masses,  no organomegaly Extremities: extremities normal, atraumatic, no cyanosis or edema Skin: Skin color, texture, turgor normal. No rashes or lesions Lymph nodes: Cervical, supraclavicular, and axillary nodes normal. No abnormal inguinal nodes palpated Neurologic: Grossly normal   Pelvic: External genitalia:  no lesions              Urethra:  normal appearing urethra with no masses, tenderness or lesions              Bartholins and Skenes: normal                 Vagina: normal appearing vagina with normal color and discharge, no lesions              Cervix: no lesions              Pap taken: No. Bimanual Exam:  Uterus:  Difficulty to fully assess fundus but does not feel enlarged and is mobile and non-tender              Adnexa: normal adnexa and no mass, fullness, tenderness              Anus:  no lesions  Chaperone, Terence Lux, CMA, was present for exam.  A:  Well Woman with normal exam H/o PCOS H/o 5cm adenomyoma confirmed by CT Desires for pregnancy Dysmenorrhea and menorrhagia that is improved with Aygestin (have communicated with Dr. Kerin Perna prior to her appt who recommended Rosanna Randy and rx has been written, prior auth completed) Hypertension Smoker but trying to stop H/o trich x 2 in the past H/o depression  P:   Mammogram guidelines reviewed.  pap smear neg with neg HR HPV 02/06/2019.  Not indicated today.  Had Gc/Chl within the last year Rx for metformin 500mg  daily to pharmacy.  #90/4RF Rx for Zoloft 50mg  daily.  #90/4RF She has stopped her Olmesartan as she is on metformin and cycles are much more regular.  She is still on HCT and is watching her BPs.  Knows to notify  me or PCP if BP increases. return annually or prn

## 2020-02-09 ENCOUNTER — Encounter: Payer: Self-pay | Admitting: Obstetrics & Gynecology

## 2020-02-09 NOTE — Telephone Encounter (Signed)
Appeal letter pended.   Dr. Sabra Heck -please review pended letter.

## 2020-02-09 NOTE — Telephone Encounter (Signed)
Patient sent the following message through [place]. Routing to triage to assist patient with request.  Jamie Chung "[name]"  P [facility] Clinical Pool  Phone Number: [phone]  Hi, can someone please give me a call. Im still having issues getting my prescription [name]). The pharmacy still doesn't have an approval from the insurance company and they wouldn't let me get it.

## 2020-02-09 NOTE — Telephone Encounter (Signed)
[facility] calling regarding denied PA for [place]. Checking to see if Dr. Sabra Heck is doing an appeal. If so, can call [phone] ID# [id number]

## 2020-02-09 NOTE — Telephone Encounter (Signed)
Call placed to [facility] [place]. Advised PA for Jamie Chung was denied, appeal in process. Was advised patient can not use savings coupon, has to go through insurance first.   Call placed to patient to provide update. Will advise once appeal response received. Patient is agreeable to plan.

## 2020-02-10 ENCOUNTER — Encounter: Payer: Self-pay | Admitting: Obstetrics & Gynecology

## 2020-02-12 NOTE — Telephone Encounter (Signed)
Appeal letter faxed to [name].

## 2020-02-15 ENCOUNTER — Other Ambulatory Visit: Payer: Self-pay | Admitting: Obstetrics & Gynecology

## 2020-02-15 ENCOUNTER — Encounter: Payer: Self-pay | Admitting: Obstetrics & Gynecology

## 2020-02-15 MED ORDER — NORETHINDRONE ACETATE 5 MG PO TABS: 5.0000 mg | ORAL_TABLET | Freq: Two times a day (BID) | ORAL | 2 refills | Status: DC

## 2020-02-19 ENCOUNTER — Encounter: Payer: Self-pay | Admitting: Obstetrics & Gynecology

## 2020-02-22 NOTE — Telephone Encounter (Signed)
Call to [facility] for status of appeal for Freida Busman, spoke with representative Mel, was advised system is going through an update, unable to provide any information, call back at a later date.

## 2020-02-22 NOTE — Telephone Encounter (Signed)
Call placed to [facility] at [phone]. Spoke with 3 different customer service reps, update could not be provided on appeal. Jamie Chung, customer service rep confirmed appeal for Jamie Chung was received. Was advised to call [phone]. Length of call 45 min.   Call to patient to provide update on Orilissa appeal. Advised urgent appeal request submitted on 02/12/20. Recommended patient f/u with plan for update on appeal. Provided Case # [id number]. Patient is agreeable.   Call placed to [facility] at [phone], spoke with [facility], was advised this is not the correct department.

## 2020-02-25 NOTE — Telephone Encounter (Signed)
Call to patient, left detailed message, ok per dpr. Advised letter received in [place] mail from [facility] dated 02/12/20, regarding appeal submitted for Orilissa. Appeal approved Freida Busman, valid until 08/11/2020. Please f/u with pharmacy for getting medication filled. Return call to office if any additional questions.   Routing to provider for final review. Patient is agreeable to disposition. Will close encounter.

## 2020-03-04 ENCOUNTER — Telehealth: Payer: Self-pay | Admitting: Obstetrics & Gynecology

## 2020-03-04 ENCOUNTER — Encounter: Payer: Self-pay | Admitting: Obstetrics & Gynecology

## 2020-03-04 ENCOUNTER — Encounter: Payer: Self-pay | Admitting: Certified Nurse Midwife

## 2020-03-04 NOTE — Telephone Encounter (Signed)
Left message to call Liat Mayol at [phone]. 

## 2020-03-04 NOTE — Telephone Encounter (Signed)
Jamie Chung "Jamie Chung"  P [facility] Clinical Pool  Phone Number: [phone]  Hi, When does the results come back from my check up?

## 2020-03-09 NOTE — Telephone Encounter (Signed)
Spoke with patient.   1. Patient requesting pap results from 02/08/20 AEX. Advised no pap at AEX, per OV notes 02/06/19 pap and hpv neg, not indicated. Will review with Dr. Sabra Heck and advise when next pap due.   2. Patient started [place] 2 wks ago, reports pelvic pain is not daily as it has been, now intermittent with joint pain, some days are better than others. Patient will see Dr. Kerin Perna in May. Patient states she has applied for intermittent FMLA through her employer due to the amount of time she has been out of work for OV and symptoms. Patient is asking if FMLA can be completed by Dr. Sabra Heck? She has applied for FMLA until 05/04/20. Advised patient I will review request with Dr. Sabra Heck and f/u, patient agreeable.   Routing to Dr. Sabra Heck to review and advise.

## 2020-03-11 ENCOUNTER — Encounter: Payer: Self-pay | Admitting: Obstetrics & Gynecology

## 2020-03-11 ENCOUNTER — Telehealth: Payer: Self-pay | Admitting: Obstetrics & Gynecology

## 2020-03-11 DIAGNOSIS — Z0289 Encounter for other administrative examinations: Secondary | ICD-10-CM

## 2020-03-11 NOTE — Telephone Encounter (Signed)
Spoke with patient. Advised of message as seen below from [place]. Patient verbalizes understanding. Patient will bring FMLA forms to the office for completion. Encounter closed.

## 2020-03-11 NOTE — Telephone Encounter (Signed)
1) with no hx of abnormal pap smears and pap neg/HR HPV neg from 02/06/2019.  Repeat in 2023.    2) yes, I will do FMLA.  She's had to be out of work several times for very legitimate reasons.  Thanks.

## 2020-03-11 NOTE — Telephone Encounter (Signed)
Patient dropped off FMLA forms and sent to [facility] to complete.

## 2020-03-14 NOTE — Telephone Encounter (Signed)
Spoke with patient. Advised FMLA paperwork to Dr.Nobis's review before sending in forms. Patient is agreeable.

## 2020-03-22 ENCOUNTER — Ambulatory Visit (INDEPENDENT_AMBULATORY_CARE_PROVIDER_SITE_OTHER): Payer: 59 | Admitting: Obstetrics & Gynecology

## 2020-03-22 ENCOUNTER — Encounter: Payer: Self-pay | Admitting: Obstetrics & Gynecology

## 2020-03-22 ENCOUNTER — Telehealth: Payer: Self-pay | Admitting: *Deleted

## 2020-03-22 ENCOUNTER — Other Ambulatory Visit: Payer: Self-pay | Admitting: Obstetrics & Gynecology

## 2020-03-22 ENCOUNTER — Other Ambulatory Visit: Payer: Self-pay

## 2020-03-22 VITALS — BP 140/84 | HR 70 | Temp 97.7°F | Resp 16 | Wt 222.0 lb

## 2020-03-22 DIAGNOSIS — N921 Excessive and frequent menstruation with irregular cycle: Secondary | ICD-10-CM

## 2020-03-22 DIAGNOSIS — N926 Irregular menstruation, unspecified: Secondary | ICD-10-CM | POA: Diagnosis not present

## 2020-03-22 LAB — POCT URINE PREGNANCY: Preg Test, Ur: NEGATIVE

## 2020-03-22 NOTE — Progress Notes (Signed)
GYNECOLOGY  VISIT  CC:   Dizziness with bleeding  HPI: 39 y.o. G36P0020 Single Black or African American female here for irregular bleeding with associated dizziness that was present yesterday.  Pt has hx of 5cm adenomyoma with very heavy bleeding and anemia.  Pt started aygestin due to menorrhagia.  Bleeding has improved but report she starting spotting in the end of February.  This lasted about 14 days.  She was then started on Orilissa 200mg  twice daily about 4 weeks ago.  She didn't have any bleeding for 3 weeks and then bleeding started with a lot of cramping and some clots yesterday.  She felt dizzy with this bleeding as well but report bleeding really improved today.  It is rather light today.  Denies cramping, SOB, palpitations or dizziness today.  She is having some joint pain with the Freida Busman but she is tolerating this well.  Has appoint with Dr. Kerin Perna in early May she does have some desires to be pregnant.  She states today if it becomes apparent that she is going to have to do a lot for conception, she may decide just to have hysterectomy to be done with all the pain and bleeding she is experienced over the past several months.  GYNECOLOGIC HISTORY: Patient's last menstrual period was 03/21/2020 (exact date). Contraception: none, currently taking orlissa 200mg  bid & aygestin 5mg  bid Menopausal hormone therapy: none Hb: 13.8 UPT: neg today  Patient Active Problem List   Diagnosis Date Noted  . Adenomyoma 01/08/2020  . History of major depression 03/05/2019  . Essential hypertension 03/05/2019  . PCOS (polycystic ovarian syndrome) 03/05/2019    Past Medical History:  Diagnosis Date  . Allergy   . Anemia   . Hypertension   . Migraines   . PCOS (polycystic ovarian syndrome)   . STD (sexually transmitted disease)    trichomonas    Past Surgical History:  Procedure Laterality Date  . LUMBAR PUNCTURE    . TONSILLECTOMY      MEDS:   Current Outpatient Medications on  File Prior to Visit  Medication Sig Dispense Refill  . albuterol (VENTOLIN HFA) 108 (90 Base) MCG/ACT inhaler Inhale 2 puffs into the lungs every 4 (four) hours as needed for wheezing or shortness of breath. 18 g 0  . benzonatate (TESSALON) 100 MG capsule Take 1 capsule (100 mg total) by mouth every 8 (eight) hours. 21 capsule 0  . Elagolix Sodium (ORILISSA) 200 MG TABS Take 200 mg by mouth 2 (two) times daily. 60 tablet 1  . hydrochlorothiazide (HYDRODIURIL) 25 MG tablet Take 25 mg by mouth daily.     . hydrOXYzine (ATARAX/VISTARIL) 25 MG tablet Take 1 tablet (25 mg total) by mouth every 6 (six) hours as needed for itching. 12 tablet 0  . metFORMIN (GLUCOPHAGE) 500 MG tablet Take 1 tablet (500 mg total) by mouth daily with breakfast. 90 tablet 1  . norethindrone (AYGESTIN) 5 MG tablet Take 1 tablet (5 mg total) by mouth 2 (two) times daily. 60 tablet 2  . sertraline (ZOLOFT) 50 MG tablet Take 1 tablet (50 mg total) by mouth daily. 30 tablet 1   No current facility-administered medications on file prior to visit.    ALLERGIES: Lisinopril and Mobic [meloxicam]  Family History  Problem Relation Age of Onset  . Hypertension Mother   . Hypertension Maternal Grandmother   . Heart attack Maternal Grandfather   . Breast cancer Paternal Grandmother     SH:  Single, smoker  Review  of Systems  Constitutional: Negative.   HENT: Negative.   Eyes: Negative.   Respiratory: Negative.   Cardiovascular: Negative.   Gastrointestinal: Negative.   Endocrine: Negative.   Genitourinary:       Clotting with cycle, pain  Musculoskeletal: Negative.   Skin: Negative.   Allergic/Immunologic: Negative.   Neurological: Negative.   Psychiatric/Behavioral: Negative.     PHYSICAL EXAMINATION:    BP 140/84   Pulse 70   Temp 97.7 F (36.5 C) (Skin)   Resp 16   Wt 222 lb (100.7 kg)   LMP 03/21/2020 (Exact Date)   BMI 33.75 kg/m     General appearance: alert, cooperative and appears stated age CV:   Regular rate and rhythm Lungs:  clear to auscultation, no wheezes, rales or rhonchi, symmetric air entry Abdomen: soft, non-tender; bowel sounds normal; no masses,  no organomegaly Lymph:  no inguinal LAD noted  Pelvic: External genitalia:  no lesions              Urethra:  normal appearing urethra with no masses, tenderness or lesions              Bartholins and Skenes: normal                 Vagina: normal appearing vagina with normal color and discharge, no lesions              Cervix: no lesions and minimal bleeding noted today              Bimanual Exam:  Uterus:  enlarged, 8 weeks size              Adnexa: no mass, fullness, tenderness              Rectovaginal: No.  Chaperone, Terence Lux, CMA, was present for exam.  Assessment: H/o 5cm adenomyosis with h/o menorrhagia and anemia, improved with use of aygestin and orilissa  Plan: Recommended stopping aygestin today.  She is very afraid that bleeding may start again.  Would like to stay on current regimen until sees Dr. Kerin Perna.  Has prescriptions and doesn't need RFs at this time. CBC and iron levels obtained today.

## 2020-03-22 NOTE — Telephone Encounter (Signed)
Completed FMLA forms faxed to [place] and [facility] at [phone] with confirmation. Patient has been notified. Encounter closed.

## 2020-03-22 NOTE — Telephone Encounter (Signed)
Spoke with patient. Patient reports bleeding off and on for approximately 15 days in March, is unsure when she is having a menses or not. Reports bleeding now, "not heavy, more like a normal period with a lot of clots". Patient reports passing several clots, "size of fifty cent pieces". Reports dizziness when passing clots and when standing or moving. Currently taking Aygestin 5 mg PO bid and Orilissa 200 mg bid. Patient is concerned, states she is bleeding the heaviest since starting medications. Patient is scheduled to see Dr. Kerin Perna May 4 or 5th. Menses cramps not controlled with OTC ibuprofen or tylenol.   OV scheduled for today at 12pm with Dr. Sabra Heck. [id number] prescreen negative, precautions reviewed.   Routing to provider for final review. Patient is agreeable to disposition. Will close encounter.

## 2020-03-22 NOTE — Telephone Encounter (Signed)
Jamie Bulla "[name]"  [facility]  Phone Number: [phone]  Hi Dr Sabra Heck. Just wanted to let you know that im back to passing large blood clots and extremely bad pains again. Ibuprofen and Tylenol is no longer working at this time.

## 2020-03-22 NOTE — Telephone Encounter (Signed)
This was signed and put on your desk.  Thanks.

## 2020-03-22 NOTE — Telephone Encounter (Signed)
Left message to call Ziyan Hillmer, RN at [facility] [phone].   

## 2020-03-23 LAB — IRON,TIBC AND FERRITIN PANEL
Ferritin: 16 ng/mL (ref 15–150)
Iron Saturation: 18 % (ref 15–55)
Iron: 74 ug/dL (ref 27–159)
Total Iron Binding Capacity: 415 ug/dL (ref 250–450)
UIBC: 341 ug/dL (ref 131–425)

## 2020-03-23 LAB — CBC
Hematocrit: 42.7 % (ref 34.0–46.6)
Hemoglobin: 14 g/dL (ref 11.1–15.9)
MCH: 28.1 pg (ref 26.6–33.0)
MCHC: 32.8 g/dL (ref 31.5–35.7)
MCV: 86 fL (ref 79–97)
Platelets: 272 10*3/uL (ref 150–450)
RBC: 4.99 x10E6/uL (ref 3.77–5.28)
RDW: 21.8 % — ABNORMAL HIGH (ref 11.7–15.4)
WBC: 6 10*3/uL (ref 3.4–10.8)

## 2020-04-11 ENCOUNTER — Telehealth: Payer: Self-pay | Admitting: Obstetrics & Gynecology

## 2020-04-11 ENCOUNTER — Encounter: Payer: Self-pay | Admitting: Obstetrics & Gynecology

## 2020-04-11 NOTE — Telephone Encounter (Signed)
Non-Urgent Medical Question Received: Today Message Contents  Jamie Chung" sent to [place]  Phone Number: [phone]  [facility] Dr Sabra Heck. I have decided not to go through the process of getting pregnant right now. I will give Dr Alveda Reasons office a call to let them know before my appointment next week. I have some issues going on right now and now just isn't the time for me. At this point I guess we can discuss other ways to deal with the Adenomyosis whether its birth control or possibly a hysterectomy. Feel free to give me a call or set up a virtual visit with to discuss what to do moving forward.

## 2020-04-11 NOTE — Telephone Encounter (Signed)
Spoke with patient. MyChart consult scheduled for 04/19/20 at 4:30pm with Dr. Sabra Heck.   Patient reports she is still on Orilissa bid. Has cancelled OV with Dr. Kerin Perna. Would like to discuss alternative tx options for adenomyosis.   Advised patient I will update Dr. Sabra Heck, our office will return call if any additional recommendations. Patient agreeable.   Routing to provider for final review. Patient is agreeable to disposition. Will close encounter.

## 2020-04-19 ENCOUNTER — Other Ambulatory Visit: Payer: Self-pay

## 2020-04-19 ENCOUNTER — Encounter: Payer: Self-pay | Admitting: Obstetrics & Gynecology

## 2020-04-19 ENCOUNTER — Telehealth (INDEPENDENT_AMBULATORY_CARE_PROVIDER_SITE_OTHER): Payer: 59 | Admitting: Obstetrics & Gynecology

## 2020-04-19 DIAGNOSIS — N921 Excessive and frequent menstruation with irregular cycle: Secondary | ICD-10-CM

## 2020-04-19 DIAGNOSIS — E282 Polycystic ovarian syndrome: Secondary | ICD-10-CM

## 2020-04-19 DIAGNOSIS — D369 Benign neoplasm, unspecified site: Secondary | ICD-10-CM | POA: Diagnosis not present

## 2020-04-19 DIAGNOSIS — Z8742 Personal history of other diseases of the female genital tract: Secondary | ICD-10-CM

## 2020-04-19 DIAGNOSIS — F17200 Nicotine dependence, unspecified, uncomplicated: Secondary | ICD-10-CM

## 2020-04-19 MED ORDER — SERTRALINE HCL 100 MG PO TABS: 100.0000 mg | ORAL_TABLET | Freq: Every day | ORAL | 2 refills | Status: DC

## 2020-04-19 MED ORDER — ORILISSA 200 MG PO TABS: 200.0000 mg | ORAL_TABLET | Freq: Two times a day (BID) | ORAL | 1 refills | Status: DC

## 2020-04-19 MED ORDER — METFORMIN HCL 500 MG PO TABS: 500.0000 mg | ORAL_TABLET | Freq: Every day | ORAL | 3 refills | Status: DC

## 2020-04-19 MED ORDER — NORETHINDRONE 0.35 MG PO TABS
1.0000 | ORAL_TABLET | Freq: Every day | ORAL | 3 refills | Status: DC
Start: 2020-04-19 — End: 2020-04-29

## 2020-04-19 NOTE — Progress Notes (Signed)
Virtual Visit via Video Note  I connected with [place]. Kimber on 04/19/20 at  4:30 PM EDT by a video enabled telemedicine application and verified that I am speaking with the correct person using two identifiers.  Location: Patient: home Provider: office   I discussed the limitations of evaluation and management by telemedicine and the availability of in person appointments. The patient expressed understanding and agreed to proceed.  History of Present Illness: 39 yo G2P0020 with hx of enlarged uterus, menorrhagia and adenomyosis who was supposed to be seen by Dr. Kerin Perna for desired pregnancy.  Significant other just told her he thought they should not be trying for pregnancy right now and need to be working on their relationship.  She was surprised and disappointed by this.  They are going to see a couples counselor so they both are committed to their relationship.  Wanted to touch base and let me know this as well as seek advice for her bleeding.  She is currently on Orilissa.  Was on OCPs in the past with good bleeding control.  Is a smoker so cannot be on the same pill at this time.  D/w pt POPs or IUD.  She is willing to try POP at this time.  Was on aygestin so will just transition to POP.  May have increased bleeding so will have to see if this helps.  If not, may need to consider IUD.    Is on Zoloft and feels this could be increased.  Will increase to 100mg .  Side effects, risks, improvement discussed.     Observations/Objective: WNWD AAF NAD  Assessment and Plan: H/o menorrhagia due to 5cm adenomyoma in uterus PCOS, on metformin Depression/anxiety  Follow Up Instructions: Will stop aygestin and start micronor, 1 po q day. Continue Orilissa 200mg  BID Increase Zoloft to 100nmg daily Continue Metformin 500mg  daily   I discussed the assessment and treatment plan with the patient. The patient was provided an opportunity to ask questions and all were answered. The patient  agreed with the plan and demonstrated an understanding of the instructions.   The patient was advised to call back or seek an in-person evaluation if the symptoms worsen or if the condition fails to improve as anticipated.  I provided 20 minutes of non-face-to-face time during this encounter.   Megan Salon, MD

## 2020-04-25 ENCOUNTER — Encounter: Payer: Self-pay | Admitting: Obstetrics & Gynecology

## 2020-04-25 DIAGNOSIS — F17200 Nicotine dependence, unspecified, uncomplicated: Secondary | ICD-10-CM | POA: Insufficient documentation

## 2020-04-25 DIAGNOSIS — N921 Excessive and frequent menstruation with irregular cycle: Secondary | ICD-10-CM | POA: Insufficient documentation

## 2020-04-27 ENCOUNTER — Encounter: Payer: Self-pay | Admitting: Obstetrics & Gynecology

## 2020-04-28 ENCOUNTER — Telehealth: Payer: Self-pay | Admitting: *Deleted

## 2020-04-28 DIAGNOSIS — Z3009 Encounter for other general counseling and advice on contraception: Secondary | ICD-10-CM

## 2020-04-28 NOTE — Telephone Encounter (Signed)
Jamie Bulla "[name]"  [facility]  Phone Number: [phone]  Hi Dr. Sabra Heck. You wanted me to contact you and give you an update after I started the new birth control pills. Ever since I've started taking them my pelvic pain has gotten worst. And I've started heavy bleeding. I take them the same time every day along with the [place]. This is really stressful because now I've been bleeding nonstop altogether for almost 2 months straight.  I just wanted to update you and see what your thoughts are. I really wish I could take something to stop bleeding atleast for a month.

## 2020-04-28 NOTE — Telephone Encounter (Signed)
Call reviewed with Dr. Sabra Heck. Patient is a smoker, unable to be on estrogen. Needs contraceptive and cycle control, currently on Micronor.  Mirena IUD recommended.   Call returned to patient. Advised as seen above per Dr. Sabra Heck. Brief explanation of IUD insertion provided. Patient has additional questions, agreeable to proceed with scheduling, will further discuss with Dr. Sabra Heck prior to insertion. Patient scheduled for 5/14 at 8:30am, order placed for precert. Advised to take Motrin 800 mg with food and water one hour before procedure.  Patient requesting additional RX for pain medication. Advised patient to continue OTC Motrin and tylenol as previously advised until OV. Advised to continue [place] and POP until she is seen in the office. ER precautions reviewed for severe pain or heavy bleeding. Patient verbalizes understanding and is agreeable.    Message to business office for precert.   Routing to Dr. Sabra Heck for final review.   Cc: Magdalene Patricia

## 2020-04-28 NOTE — Telephone Encounter (Signed)
Spoke with patient. Started POP 4-5 days ago, taking nightly at 6pm. No missed or late pills. Also taking Orilissa 200 mg bid. Patient reports increase in clots and cramps since starting POP. Reports multiple small clots come out when standing. Changing a non saturated pad q3hrs. Pain 10/10. Taking 2 ibuprofen and 1 tylenol q5hrs, not effective.  Denies N/V fever/chills, SOB, weakness, lightheadedness, headache.   MyChart visit on 5/4, discussed IUD, patient states she does not want a IUD, wants a hysterectomy. Patient states the aygestin was working better for bleeding. Advised OV needed for further evaluation, patient declined, states she does not feel comfortable leaving the house, is concerned she may bleed through clothing. Advised patient I will provide update to Dr. Sabra Heck and return call. Patient agreeable.   Dr. Sabra Heck -please review.

## 2020-04-28 NOTE — Telephone Encounter (Signed)
Jamie Chung "Jamie Chung"  [facility]  Phone Number: [phone]  I also want to add that I'm passing blood clots again

## 2020-04-29 ENCOUNTER — Telehealth: Payer: Self-pay

## 2020-04-29 ENCOUNTER — Other Ambulatory Visit: Payer: Self-pay | Admitting: Obstetrics & Gynecology

## 2020-04-29 ENCOUNTER — Ambulatory Visit (INDEPENDENT_AMBULATORY_CARE_PROVIDER_SITE_OTHER): Payer: 59 | Admitting: Obstetrics & Gynecology

## 2020-04-29 ENCOUNTER — Encounter (HOSPITAL_COMMUNITY): Payer: Self-pay | Admitting: Obstetrics & Gynecology

## 2020-04-29 ENCOUNTER — Other Ambulatory Visit: Payer: Self-pay

## 2020-04-29 ENCOUNTER — Encounter: Payer: Self-pay | Admitting: Obstetrics & Gynecology

## 2020-04-29 VITALS — BP 120/80 | HR 72 | Temp 97.2°F | Resp 16 | Wt 215.0 lb

## 2020-04-29 DIAGNOSIS — D369 Benign neoplasm, unspecified site: Secondary | ICD-10-CM | POA: Diagnosis not present

## 2020-04-29 DIAGNOSIS — N921 Excessive and frequent menstruation with irregular cycle: Secondary | ICD-10-CM | POA: Diagnosis not present

## 2020-04-29 MED ORDER — NORETHINDRONE ACETATE 5 MG PO TABS: 15.0000 mg | ORAL_TABLET | Freq: Two times a day (BID) | ORAL | 0 refills | Status: DC

## 2020-04-29 MED ORDER — HYDROCODONE-ACETAMINOPHEN 5-325 MG PO TABS: 1.0000 | ORAL_TABLET | Freq: Four times a day (QID) | ORAL | 0 refills | Status: DC | PRN

## 2020-04-29 NOTE — Progress Notes (Signed)
GYNECOLOGY  VISIT  CC:   menorrhagia  HPI: 39 y.o. G70P0020 Single Black or African American female here for increased vaginal bleeding since transitioning to micronor from aygestin.  Pt is on orilissa as well.  Advised today to stop as she continues to smoke.  She states she wants a hysterectomy now.  Was to see REI about three weeks ago but cancelled this appt and boyfriend did not think this was a good idea.  Now, she is ready for hysterectomy due to amount of bleeding she is having.  Feel this is too abrupt of a decision.  I know she is tired of bleeding/cramping but I do not want her to make a permanent decision that she will regret. Pt does not want to undergo the pain of IUD insertion in the office.  D/w pt doing D&C and then proceeding with IUD insertion.  Risks of infection, bleeding, risk of transfusion, uterine perforation and need for additional surgery all discussed.  Pt is willing to consider something more conservative.  Will see if can plan in the OR.  Offered tomorrow but pt declined.  Willing to try and do early next week.  She is having significant cramping as well today.  Has been on motrin and tylenol without improvement.  Would like something stronger.  Is concerned IUD will not help this.  Admits pain is worse when bleeding is worse.  Therefore, IUD should help both pain and bleeding.  She is aware that I cannot guarantee pain will be better.  GYNECOLOGIC HISTORY: No LMP recorded (exact date). (Menstrual status: Other). Contraception: microno Menopausal hormone therapy: n/a  Patient Active Problem List   Diagnosis Date Noted  . Menorrhagia with irregular cycle 04/25/2020  . Smoker 04/25/2020  . Adenomyoma 01/08/2020  . History of major depression 03/05/2019  . Essential hypertension 03/05/2019  . PCOS (polycystic ovarian syndrome) 03/05/2019    Past Medical History:  Diagnosis Date  . Allergy   . Anemia   . Hypertension   . Migraines   . PCOS (polycystic ovarian  syndrome)   . STD (sexually transmitted disease)    trichomonas    Past Surgical History:  Procedure Laterality Date  . LUMBAR PUNCTURE    . TONSILLECTOMY      MEDS:   Current Outpatient Medications on File Prior to Visit  Medication Sig Dispense Refill  . albuterol (VENTOLIN HFA) 108 (90 Base) MCG/ACT inhaler Inhale 2 puffs into the lungs every 4 (four) hours as needed for wheezing or shortness of breath. 18 g 0  . Elagolix Sodium (ORILISSA) 200 MG TABS Take 200 mg by mouth 2 (two) times daily. 60 tablet 1  . hydrochlorothiazide (HYDRODIURIL) 25 MG tablet Take 25 mg by mouth daily.     . hydrOXYzine (ATARAX/VISTARIL) 25 MG tablet Take 1 tablet (25 mg total) by mouth every 6 (six) hours as needed for itching. 12 tablet 0  . metFORMIN (GLUCOPHAGE) 500 MG tablet Take 1 tablet (500 mg total) by mouth daily with breakfast. 90 tablet 3  . phentermine (ADIPEX-P) 37.5 MG tablet Take by mouth.    . sertraline (ZOLOFT) 100 MG tablet Take 1 tablet (100 mg total) by mouth daily. 30 tablet 2   No current facility-administered medications on file prior to visit.    ALLERGIES: Lisinopril and Mobic [meloxicam]  Family History  Problem Relation Age of Onset  . Hypertension Mother   . Hypertension Maternal Grandmother   . Heart attack Maternal Grandfather   . Breast cancer  Paternal Grandmother     SH:  Single, smoker  Review of Systems  Genitourinary: Positive for menstrual problem, pelvic pain and vaginal bleeding.    PHYSICAL EXAMINATION:    BP 120/80   Pulse 72   Temp (!) 97.2 F (36.2 C) (Skin)   Resp 16   Wt 215 lb (97.5 kg)   LMP  (Exact Date)   BMI 32.69 kg/m     General appearance: alert, cooperative and appears stated age Abdomen: soft, non-tender; bowel sounds normal; no masses,  no organomegaly Lymph:  no inguinal LAD noted  Pelvic: External genitalia:  no lesions              Urethra:  normal appearing urethra with no masses, tenderness or lesions               Bartholins and Skenes: normal                 Vagina: normal appearing vagina with normal color and discharge, no lesions              Cervix: no lesions              Bimanual Exam:  Uterus:  enlarged, 8 weeks size              Adnexa: no mass, fullness, tenderness  Chaperone, Terence Lux, CMA, was present for exam.  Assessment: Menorrhagia after transitioning from aygestin to micrnor On orilissa but still smoking so advised she needs to stop  Plan: CBC obtained today Rx for Vicodin 5/325 1-2 tabs po q 4-6 hours prn.  #20/0RF.   Aygestin 15mg  bid.  #60/0RF.  She will start this today. Plan D&C with IUD placement, intraop ultrasound as well.    About 30 minutes total spent with pt.

## 2020-04-29 NOTE — Telephone Encounter (Signed)
Spoke with patient. Patient is agreeable to have surgery on 05/02/2020. Hysteroscopy D&C Myosure with Mirena IUD insertion scheduled for 05/02/2020 at 0730 at [facility]. Patient is agreeable. STAT COVID test scheduled for 04/30/2020 at 12:05 pm at [facility] location. Patient is aware of the need to quarantine after test until surgery. 2 week post op scheduled for 05/19/2020 at 1 pm with Dr.Bellamy. Patient is agreeable to all dates and times. Surgery instructions reviewed with patient. Patient verbalizes understanding.  Routing to provider and will close encounter.

## 2020-04-29 NOTE — Progress Notes (Signed)
Pt denies SOB, chest pain, and being under the care of a cardiologist. Pt stated that Zara Chess, NP is PCP. Pt denies having an echo, stress test and cardiac cath. Pt denies having an EKG and chest x ray in the last year. Pt made aware to stop taking Aspirin (unless otherwise advised by surgeon), Phentermine  ( last dose 04/29/20), vitamins, fish oil and herbal medications. Do not take any NSAIDs ie: Ibuprofen, Advil, Naproxen (Aleve), Motrin, BC and Goody Powder. Pt made aware to hold Metformin DOS ( for PCOS). Pt reminded to quarantine. Pt verbalized understanding of all pre-op instructions.

## 2020-04-30 ENCOUNTER — Other Ambulatory Visit (HOSPITAL_COMMUNITY): Payer: Self-pay

## 2020-04-30 LAB — CBC
Hematocrit: 37.2 % (ref 34.0–46.6)
Hemoglobin: 12.6 g/dL (ref 11.1–15.9)
MCH: 29.7 pg (ref 26.6–33.0)
MCHC: 33.9 g/dL (ref 31.5–35.7)
MCV: 88 fL (ref 79–97)
Platelets: 296 10*3/uL (ref 150–450)
RBC: 4.24 x10E6/uL (ref 3.77–5.28)
RDW: 18.2 % — ABNORMAL HIGH (ref 11.7–15.4)
WBC: 5.5 10*3/uL (ref 3.4–10.8)

## 2020-04-30 NOTE — Progress Notes (Signed)
Patient called and reported that she was unable to locate the [place] testing site and is unable to return until they are closed. Advised her we would test her before surgery on Monday.

## 2020-05-01 NOTE — Anesthesia Preprocedure Evaluation (Addendum)
Anesthesia Evaluation  Patient identified by MRN, date of birth, ID band Patient awake    Reviewed: Allergy & Precautions  Airway Mallampati: II  TM Distance: >3 FB     Dental   Pulmonary Current Smoker,    breath sounds clear to auscultation       Cardiovascular hypertension,  Rhythm:Regular Rate:Normal     Neuro/Psych    GI/Hepatic GERD  ,  Endo/Other    Renal/GU      Musculoskeletal   Abdominal   Peds  Hematology  (+) anemia ,   Anesthesia Other Findings   Reproductive/Obstetrics                            Anesthesia Physical Anesthesia Plan  ASA: III  Anesthesia Plan: General   Post-op Pain Management:    Induction: Intravenous  PONV Risk Score and Plan: 3 and Ondansetron, Dexamethasone and Midazolam  Airway Management Planned:   Additional Equipment:   Intra-op Plan:   Post-operative Plan: Possible Post-op intubation/ventilation  Informed Consent: I have reviewed the patients History and Physical, chart, labs and discussed the procedure including the risks, benefits and alternatives for the proposed anesthesia with the patient or authorized representative who has indicated his/her understanding and acceptance.     Dental advisory given  Plan Discussed with: CRNA and Anesthesiologist  Anesthesia Plan Comments:        Anesthesia Quick Evaluation

## 2020-05-02 ENCOUNTER — Encounter (HOSPITAL_COMMUNITY): Payer: Self-pay | Admitting: Obstetrics & Gynecology

## 2020-05-02 ENCOUNTER — Ambulatory Visit (HOSPITAL_COMMUNITY): Payer: 59 | Admitting: Anesthesiology

## 2020-05-02 ENCOUNTER — Encounter (HOSPITAL_COMMUNITY)
Admission: RE | Disposition: A | Discharge status: Home / Self Care | Payer: Self-pay | Source: Home / Self Care | Attending: Obstetrics & Gynecology

## 2020-05-02 ENCOUNTER — Ambulatory Visit (HOSPITAL_COMMUNITY)
Admission: RE | Admit: 2020-05-02 | Discharge: 2020-05-02 | Disposition: A | Discharge status: Home / Self Care | Payer: 59 | Attending: Obstetrics & Gynecology | Admitting: Obstetrics & Gynecology

## 2020-05-02 ENCOUNTER — Other Ambulatory Visit: Payer: Self-pay

## 2020-05-02 DIAGNOSIS — Z862 Personal history of diseases of the blood and blood-forming organs and certain disorders involving the immune mechanism: Secondary | ICD-10-CM

## 2020-05-02 DIAGNOSIS — I10 Essential (primary) hypertension: Secondary | ICD-10-CM | POA: Diagnosis not present

## 2020-05-02 DIAGNOSIS — N921 Excessive and frequent menstruation with irregular cycle: Secondary | ICD-10-CM | POA: Insufficient documentation

## 2020-05-02 DIAGNOSIS — Z79899 Other long term (current) drug therapy: Secondary | ICD-10-CM | POA: Insufficient documentation

## 2020-05-02 DIAGNOSIS — E282 Polycystic ovarian syndrome: Secondary | ICD-10-CM | POA: Insufficient documentation

## 2020-05-02 DIAGNOSIS — N840 Polyp of corpus uteri: Secondary | ICD-10-CM | POA: Insufficient documentation

## 2020-05-02 DIAGNOSIS — M1990 Unspecified osteoarthritis, unspecified site: Secondary | ICD-10-CM | POA: Insufficient documentation

## 2020-05-02 DIAGNOSIS — Z793 Long term (current) use of hormonal contraceptives: Secondary | ICD-10-CM | POA: Insufficient documentation

## 2020-05-02 DIAGNOSIS — N800 Endometriosis of uterus: Secondary | ICD-10-CM

## 2020-05-02 DIAGNOSIS — F419 Anxiety disorder, unspecified: Secondary | ICD-10-CM | POA: Insufficient documentation

## 2020-05-02 DIAGNOSIS — Z20822 Contact with and (suspected) exposure to covid-19: Secondary | ICD-10-CM | POA: Insufficient documentation

## 2020-05-02 DIAGNOSIS — K219 Gastro-esophageal reflux disease without esophagitis: Secondary | ICD-10-CM | POA: Diagnosis not present

## 2020-05-02 DIAGNOSIS — F329 Major depressive disorder, single episode, unspecified: Secondary | ICD-10-CM | POA: Insufficient documentation

## 2020-05-02 DIAGNOSIS — R9431 Abnormal electrocardiogram [ECG] [EKG]: Secondary | ICD-10-CM | POA: Diagnosis not present

## 2020-05-02 DIAGNOSIS — Z7984 Long term (current) use of oral hypoglycemic drugs: Secondary | ICD-10-CM | POA: Insufficient documentation

## 2020-05-02 DIAGNOSIS — F17210 Nicotine dependence, cigarettes, uncomplicated: Secondary | ICD-10-CM | POA: Insufficient documentation

## 2020-05-02 DIAGNOSIS — D649 Anemia, unspecified: Secondary | ICD-10-CM | POA: Insufficient documentation

## 2020-05-02 DIAGNOSIS — Z30430 Encounter for insertion of intrauterine contraceptive device: Secondary | ICD-10-CM

## 2020-05-02 HISTORY — DX: Anxiety disorder, unspecified: F41.9

## 2020-05-02 HISTORY — DX: Unspecified osteoarthritis, unspecified site: M19.90

## 2020-05-02 HISTORY — DX: Gastro-esophageal reflux disease without esophagitis: K21.9

## 2020-05-02 HISTORY — PX: INTRAUTERINE DEVICE (IUD) INSERTION: SHX5877

## 2020-05-02 HISTORY — DX: Excessive and frequent menstruation with regular cycle: N92.0

## 2020-05-02 HISTORY — DX: Depression, unspecified: F32.A

## 2020-05-02 HISTORY — PX: DILATATION & CURETTAGE/HYSTEROSCOPY WITH MYOSURE: SHX6511

## 2020-05-02 LAB — BASIC METABOLIC PANEL
Anion gap: 11 (ref 5–15)
BUN: 8 mg/dL (ref 6–20)
CO2: 24 mmol/L (ref 22–32)
Calcium: 8.9 mg/dL (ref 8.9–10.3)
Chloride: 103 mmol/L (ref 98–111)
Creatinine, Ser: 0.84 mg/dL (ref 0.44–1.00)
GFR calc Af Amer: >60 mL/min (ref >60)
GFR calc non Af Amer: >60 mL/min (ref >60)
Glucose, Bld: 99 mg/dL (ref 70–99)
Potassium: 2.8 mmol/L — ABNORMAL LOW (ref 3.5–5.1)
Sodium: 138 mmol/L (ref 135–145)

## 2020-05-02 LAB — POCT PREGNANCY, URINE: Preg Test, Ur: NEGATIVE

## 2020-05-02 LAB — SARS CORONAVIRUS 2 BY RT PCR (HOSPITAL ORDER, PERFORMED IN ~~LOC~~ HOSPITAL LAB): SARS Coronavirus 2: NEGATIVE

## 2020-05-02 SURGERY — DILATATION & CURETTAGE/HYSTEROSCOPY WITH MYOSURE
Anesthesia: General | Site: Vagina

## 2020-05-02 MED ORDER — LIDOCAINE-EPINEPHRINE 1 %-1:100000 IJ SOLN
INTRAMUSCULAR | Status: AC
  Filled 2020-05-02: qty 1

## 2020-05-02 MED ORDER — VASOPRESSIN 20 UNIT/ML IV SOLN
INTRAVENOUS | Status: DC | PRN
  Administered 2020-05-02: 2 [IU] via INTRAVENOUS

## 2020-05-02 MED ORDER — ROCURONIUM BROMIDE 10 MG/ML (PF) SYRINGE
PREFILLED_SYRINGE | INTRAVENOUS | Status: DC | PRN
  Administered 2020-05-02: 60 mg via INTRAVENOUS

## 2020-05-02 MED ORDER — MIDAZOLAM HCL 5 MG/5ML IJ SOLN
INTRAMUSCULAR | Status: DC | PRN
  Administered 2020-05-02: 2 mg via INTRAVENOUS

## 2020-05-02 MED ORDER — EPHEDRINE SULFATE 50 MG/ML IJ SOLN
INTRAMUSCULAR | Status: DC | PRN
  Administered 2020-05-02: 5 mg via INTRAVENOUS

## 2020-05-02 MED ORDER — PHENYLEPHRINE HCL (PRESSORS) 10 MG/ML IV SOLN
INTRAVENOUS | Status: DC | PRN
  Administered 2020-05-02: 80 ug via INTRAVENOUS
  Administered 2020-05-02: 120 ug via INTRAVENOUS

## 2020-05-02 MED ORDER — IBUPROFEN 800 MG PO TABS
800.0000 mg | ORAL_TABLET | Freq: Three times a day (TID) | ORAL | 0 refills | Status: DC | PRN
Start: 2020-05-02 — End: 2020-06-17

## 2020-05-02 MED ORDER — PROPOFOL 10 MG/ML IV BOLUS
INTRAVENOUS | Status: DC | PRN
  Administered 2020-05-02: 200 mg via INTRAVENOUS

## 2020-05-02 MED ORDER — FENTANYL CITRATE (PF) 100 MCG/2ML IJ SOLN
INTRAMUSCULAR | Status: DC | PRN
  Administered 2020-05-02: 50 ug via INTRAVENOUS
  Administered 2020-05-02: 100 ug via INTRAVENOUS
  Administered 2020-05-02: 50 ug via INTRAVENOUS

## 2020-05-02 MED ORDER — ONDANSETRON HCL 4 MG/2ML IJ SOLN
INTRAMUSCULAR | Status: AC
  Filled 2020-05-02: qty 2

## 2020-05-02 MED ORDER — PROPOFOL 10 MG/ML IV BOLUS
INTRAVENOUS | Status: AC
  Filled 2020-05-02: qty 40

## 2020-05-02 MED ORDER — FENTANYL CITRATE (PF) 250 MCG/5ML IJ SOLN
INTRAMUSCULAR | Status: AC
  Filled 2020-05-02: qty 5

## 2020-05-02 MED ORDER — LEVONORGESTREL 20 MCG/24HR IU IUD
INTRAUTERINE_SYSTEM | INTRAUTERINE | Status: AC
  Administered 2020-05-02: 1 via INTRAUTERINE
  Filled 2020-05-02: qty 1

## 2020-05-02 MED ORDER — LIDOCAINE-EPINEPHRINE 1 %-1:100000 IJ SOLN
INTRAMUSCULAR | Status: DC | PRN
  Administered 2020-05-02: 10 mL

## 2020-05-02 MED ORDER — KETOROLAC TROMETHAMINE 30 MG/ML IJ SOLN
INTRAMUSCULAR | Status: AC
  Filled 2020-05-02: qty 1

## 2020-05-02 MED ORDER — DEXAMETHASONE SODIUM PHOSPHATE 10 MG/ML IJ SOLN
INTRAMUSCULAR | Status: AC
  Filled 2020-05-02: qty 1

## 2020-05-02 MED ORDER — DEXAMETHASONE SODIUM PHOSPHATE 4 MG/ML IJ SOLN
INTRAMUSCULAR | Status: DC | PRN
  Administered 2020-05-02: 10 mg via INTRAVENOUS

## 2020-05-02 MED ORDER — LIDOCAINE 2% (20 MG/ML) 5 ML SYRINGE
INTRAMUSCULAR | Status: DC | PRN
  Administered 2020-05-02: 60 mg via INTRAVENOUS

## 2020-05-02 MED ORDER — SUGAMMADEX SODIUM 200 MG/2ML IV SOLN
INTRAVENOUS | Status: DC | PRN
Start: 2020-05-02 — End: 2020-05-02
  Administered 2020-05-02: 400 mg via INTRAVENOUS

## 2020-05-02 MED ORDER — MIDAZOLAM HCL 2 MG/2ML IJ SOLN
INTRAMUSCULAR | Status: AC
  Filled 2020-05-02: qty 2

## 2020-05-02 MED ORDER — LACTATED RINGERS IV SOLN: INTRAVENOUS | Status: DC

## 2020-05-02 MED ORDER — EPHEDRINE 5 MG/ML INJ
INTRAVENOUS | Status: AC
  Filled 2020-05-02: qty 10

## 2020-05-02 MED ORDER — LIDOCAINE 2% (20 MG/ML) 5 ML SYRINGE
INTRAMUSCULAR | Status: AC
  Filled 2020-05-02: qty 5

## 2020-05-02 MED ORDER — FENTANYL CITRATE (PF) 100 MCG/2ML IJ SOLN: 25.0000 ug | INTRAMUSCULAR | Status: DC | PRN

## 2020-05-02 MED ORDER — OXYCODONE-ACETAMINOPHEN 5-325 MG PO TABS: 2.0000 | ORAL_TABLET | ORAL | 0 refills | Status: DC | PRN

## 2020-05-02 MED ORDER — HYDROCODONE-ACETAMINOPHEN 5-325 MG PO TABS: 1.0000 | ORAL_TABLET | Freq: Four times a day (QID) | ORAL | 0 refills | Status: DC | PRN

## 2020-05-02 MED ORDER — ONDANSETRON HCL 4 MG/2ML IJ SOLN
INTRAMUSCULAR | Status: DC | PRN
  Administered 2020-05-02: 4 mg via INTRAVENOUS

## 2020-05-02 MED ORDER — ROCURONIUM BROMIDE 10 MG/ML (PF) SYRINGE
PREFILLED_SYRINGE | INTRAVENOUS | Status: AC
  Filled 2020-05-02: qty 10

## 2020-05-02 MED ORDER — PHENYLEPHRINE 40 MCG/ML (10ML) SYRINGE FOR IV PUSH (FOR BLOOD PRESSURE SUPPORT)
PREFILLED_SYRINGE | INTRAVENOUS | Status: AC
  Filled 2020-05-02: qty 10

## 2020-05-02 MED ORDER — SODIUM CHLORIDE 0.9 % IR SOLN
Status: DC | PRN
  Administered 2020-05-02: 3000 mL

## 2020-05-02 MED ORDER — VASOPRESSIN 20 UNIT/ML IV SOLN
INTRAVENOUS | Status: AC
  Filled 2020-05-02: qty 1

## 2020-05-02 SURGICAL SUPPLY — 19 items
CANISTER SUCT 3000ML PPV (MISCELLANEOUS) ×2 IMPLANT
CATH ROBINSON RED A/P 16FR (CATHETERS) ×2 IMPLANT
DEVICE MYOSURE LITE (MISCELLANEOUS) IMPLANT
DEVICE MYOSURE REACH (MISCELLANEOUS) IMPLANT
DILATOR CANAL MILEX (MISCELLANEOUS) IMPLANT
GLOVE BIOGEL PI IND STRL 7.0 (GLOVE) ×2 IMPLANT
GLOVE BIOGEL PI INDICATOR 7.0 (GLOVE) ×2
GLOVE ECLIPSE 6.5 STRL STRAW (GLOVE) ×2 IMPLANT
GLOVE SURG SS PI 6.5 STRL IVOR (GLOVE) ×2 IMPLANT
GOWN STRL REUS W/ TWL LRG LVL3 (GOWN DISPOSABLE) ×2 IMPLANT
GOWN STRL REUS W/TWL LRG LVL3 (GOWN DISPOSABLE) ×2
KIT PROCEDURE FLUENT (KITS) ×2 IMPLANT
KIT TURNOVER KIT B (KITS) ×2 IMPLANT
Mirena ×2 IMPLANT
PACK VAGINAL MINOR WOMEN LF (CUSTOM PROCEDURE TRAY) ×2 IMPLANT
PAD OB MATERNITY 4.3X12.25 (PERSONAL CARE ITEMS) ×2 IMPLANT
SEAL ROD LENS SCOPE MYOSURE (ABLATOR) ×2 IMPLANT
TOWEL GREEN STERILE FF (TOWEL DISPOSABLE) ×4 IMPLANT
UNDERPAD 30X36 HEAVY ABSORB (UNDERPADS AND DIAPERS) ×2 IMPLANT

## 2020-05-02 NOTE — Op Note (Signed)
05/02/2020  8:22 AM  PATIENT:  Jamie Chung  39 y.o. female  PRE-OPERATIVE DIAGNOSIS:  Menorrhagia with irregular cycle  POST-OPERATIVE DIAGNOSIS:  Menorrhagia with irregular cycle  PROCEDURE:  Procedure(s): DILATATION & CURETTAGE/HYSTEROSCOPY WITH MYOSURE INTRAUTERINE DEVICE (IUD) INSERTION/MIRENA IUD  SURGEON:  Megan Salon  ASSISTANTS: OR staff   ANESTHESIA:   general  ESTIMATED BLOOD LOSS: 10 mL  BLOOD ADMINISTERED:none   FLUIDS: 1000ccLR  UOP: 50 drained with I&O cath at beginning of procedure  SPECIMEN:  Endometrial curettings  DISPOSITION OF SPECIMEN:  PATHOLOGY  FINDINGS: fluffy appearing endometrium, normal tubal ostia, no intracavitary lesions  DESCRIPTION OF OPERATION: Patient was taken to the operating room.  She is placed in the supine position. SCDs were on her lower extremities and functioning properly. General anesthesia with an LMA was administered without difficulty. Dr. Belinda Block, anesthesia, oversaw case.  Legs were then placed in the [place] in the low lithotomy position. The legs were lifted to the high lithotomy position and the Betadine prep was used on the inner thighs perineum and vagina x3. Patient was draped in a normal standard fashion. An in and out catheterization with a red rubber Foley catheter was performed. Approximately 50 cc of clear urine was noted. A bivalve speculum was placed the vagina. The anterior lip of the cervix was grasped with single-tooth tenaculum.  A paracervical block of 1% lidocaine mixed one-to-one with epinephrine (1:100,000 units).  10 cc was used total. The cervix is dilated up to #21 [facility] dilators. The endometrial cavity sounded to 7 cm.   A 2.9 millimeter diagnostic hysteroscope was obtained. Normal saline was used as a hysteroscopic fluid. The hysteroscope was advanced through the endocervical canal into the endometrial cavity. There was too much clot to see anything within the cavity.  The  hysteroscope was switched to the [facility] with outflow tubing.  Clot was evacuated.  The tubal ostia were noted bilaterally. Additional findings included no intracavitary lesions.  The hysteroscope was removed. A #1 smooth curette was used to curette the cavity until rough gritty texture was noted in all quadrants. Cavity was visualized again.  Hysteroscope was removed.  Mirena IUD was passed into the endometrial cavity to the fundus.  It was withdrawn slightly and then the IUD was released.  Strings cut to 2cm.  At this point no other procedure was needed and this procedure was ended.  The fluid deficit was 145 cc. The tenaculum was removed from the anterior lip of the cervix. Minimal bleeding was noted.  The speculum was removed from the vagina. The prep was cleansed of the patient's skin. The legs are positioned back in the supine position. Sponge, lap, needle, initially counts were correct x2. Patient was taken to recovery in stable condition.  COUNTS:  YES  PLAN OF CARE: Transfer to PACU

## 2020-05-02 NOTE — Anesthesia Postprocedure Evaluation (Signed)
Anesthesia Post Note  Patient: Jamie Chung  Procedure(s) Performed: DILATATION & CURETTAGE/HYSTEROSCOPY WITH MYOSURE (N/A Vagina ) INTRAUTERINE DEVICE (IUD) INSERTION/MIRENA IUD (N/A Vagina )     Anesthesia Post Evaluation  Last Vitals:  Vitals:   05/02/20 0925 05/02/20 0940  BP: 132/79 137/80  Pulse: 97   Resp: (!) 21   Temp: 36.4 C   SpO2: 95%     Last Pain:  Vitals:   05/02/20 0925  PainSc: 0-No pain                 CHARLENE GREEN

## 2020-05-02 NOTE — H&P (Signed)
Jamie Chung is an 39 y.o. female G9A2 SAAF here for hysteroscopy with D&C, Mirena IUD placement due to menorrhagia and also need for contraception.  Pt has hx of menorrhagia and anemia.  Most recent hb was normal.  She was transitioning from aygestin to micronor and bleeding significantly increased at the end of last week.  She was seen on Friday.  Due to amt of bleeding, procedure recommended and offered for Saturday morning.  Pt could not proceed on that day so is here for this today.  She has been contemplating pregnancy but boyfriend is not ready to proceed with fertility treatment.  She did ask about hysterectomy last week but as she so recently was contemplating pregnancy, I felt we should try another conservative therapy to give her more time to consider such a definitive treatment option as hysterectomy.  Procedure, risk, benefits have been discussed.  She is here and ready to proceed.  Pertinent Gynecological History: Menses: menorrhagia Contraception: none DES exposure: denies Blood transfusions: none Sexually transmitted diseases: no past history Previous GYN Procedures: none  Last mammogram: n/a  Last pap: normal Date: 01/06/2019 OB History: G2, P0   Menstrual History: No LMP recorded. (Menstrual status: Other).    Past Medical History:  Diagnosis Date  . Allergy   . Anemia   . Anxiety   . Arthritis   . Depression   . GERD (gastroesophageal reflux disease)   . Hypertension   . Menorrhagia    adnomyosis  . Migraines   . PCOS (polycystic ovarian syndrome)   . STD (sexually transmitted disease)    trichomonas    Past Surgical History:  Procedure Laterality Date  . biopsy of cervix    . LUMBAR PUNCTURE    . TONSILLECTOMY    . WISDOM TOOTH EXTRACTION      Family History  Problem Relation Age of Onset  . Hypertension Mother   . Hypertension Maternal Grandmother   . Heart attack Maternal Grandfather   . Breast cancer Paternal Grandmother     Social History:   reports that she has been smoking cigarettes. She has a 2.00 pack-year smoking history. She has never used smokeless tobacco. She reports current alcohol use of about 3.0 standard drinks of alcohol per week. She reports that she does not use drugs.  Allergies:  Allergies  Allergen Reactions  . Lisinopril Swelling  . Mobic [Meloxicam] Swelling    Medications Prior to Admission  Medication Sig Dispense Refill Last Dose  . Elagolix Sodium (ORILISSA) 200 MG TABS Take 200 mg by mouth 2 (two) times daily. 60 tablet 1 Past Week at Unknown time  . hydrochlorothiazide (HYDRODIURIL) 25 MG tablet Take 25 mg by mouth daily.    05/01/2020 at Unknown time  . HYDROcodone-acetaminophen (NORCO/VICODIN) 5-325 MG tablet Take 1-2 tablets by mouth every 6 (six) hours as needed for moderate pain. 20 tablet 0 05/01/2020 at Unknown time  . hydrOXYzine (ATARAX/VISTARIL) 25 MG tablet Take 1 tablet (25 mg total) by mouth every 6 (six) hours as needed for itching. 12 tablet 0 05/01/2020 at Unknown time  . metFORMIN (GLUCOPHAGE) 500 MG tablet Take 1 tablet (500 mg total) by mouth daily with breakfast. 90 tablet 3 05/01/2020 at Unknown time  . norethindrone (AYGESTIN) 5 MG tablet Take 3 tablets (15 mg total) by mouth in the morning and at bedtime. (Patient taking differently: Take 5 mg by mouth 3 (three) times daily. ) 60 tablet 0 05/01/2020 at Unknown time  . phentermine (ADIPEX-P) 37.5  MG tablet Take 37.5 mg by mouth daily.    04/30/2020 at Unknown time  . sertraline (ZOLOFT) 100 MG tablet Take 1 tablet (100 mg total) by mouth daily. 30 tablet 2 05/01/2020 at Unknown time  . albuterol (VENTOLIN HFA) 108 (90 Base) MCG/ACT inhaler Inhale 2 puffs into the lungs every 4 (four) hours as needed for wheezing or shortness of breath. 18 g 0 More than a month at Unknown time    Review of Systems  All other systems reviewed and are negative.   Blood pressure (!) 149/73, pulse 96, temperature 98.8 F (37.1 C), resp. rate 18, height 5'  8" (1.727 m), weight 97.5 kg, SpO2 100 %. Physical Exam  Constitutional: She is oriented to person, place, and time. She appears well-developed and well-nourished.  Cardiovascular: Normal rate and regular rhythm.  Respiratory: Effort normal and breath sounds normal.  Neurological: She is alert and oriented to person, place, and time.  Skin: Skin is warm and dry.  Psychiatric: She has a normal mood and affect.    No results found for this or any previous visit (from the past 24 hour(s)).  No results found.  Assessment/Plan: 39 yo G2A2 SAAF with menorrhagia, adenomyosis here for hysteroscopy, D&C and IUD placement.  Questions answered.  Pt ready to proceed.  LEORIA PEGLOW 05/02/2020, 7:04 AM

## 2020-05-02 NOTE — Progress Notes (Signed)
Discussed with patient to inform primary care MD about the wheezing feeling patient has after any period of emotional or physical stress and the need for inhaler use.

## 2020-05-02 NOTE — Transfer of Care (Signed)
Immediate Anesthesia Transfer of Care Note  Patient: Jamie Chung. Sirmons  Procedure(s) Performed: DILATATION & CURETTAGE/HYSTEROSCOPY WITH MYOSURE (N/A Vagina ) INTRAUTERINE DEVICE (IUD) INSERTION/MIRENA IUD (N/A Vagina )  Patient Location: PACU  Anesthesia Type:General  Level of Consciousness: awake  Airway & Oxygen Therapy: Patient Spontanous Breathing and Patient connected to face mask oxygen  Post-op Assessment: Report given to RN and Post -op Vital signs reviewed and stable  Post vital signs: Reviewed and stable  Last Vitals:  Vitals Value Taken Time  BP 126/65 05/02/20 0837  Temp 36.7 C 05/02/20 0836  Pulse 100 05/02/20 0838  Resp 18 05/02/20 0838  SpO2 92 % 05/02/20 0838  Vitals shown include unvalidated device data.  Last Pain:  Vitals:   05/02/20 0705  PainSc: 6       Patients Stated Pain Goal: 3 [id number])  Complications: No apparent anesthesia complications

## 2020-05-02 NOTE — Anesthesia Procedure Notes (Signed)
Procedure Name: Intubation Date/Time: 05/02/2020 7:45 AM Performed by: Lieutenant Diego, CRNA Pre-anesthesia Checklist: Patient identified, Emergency Drugs available, Suction available and Patient being monitored Patient Re-evaluated:Patient Re-evaluated prior to induction Oxygen Delivery Method: Circle system utilized Preoxygenation: Pre-oxygenation with 100% oxygen Induction Type: IV induction Ventilation: Mask ventilation without difficulty Laryngoscope Size: Karpowicz and 2 Grade View: Grade I Tube type: Oral Tube size: 7.0 mm Number of attempts: 1 Airway Equipment and Method: Stylet and Oral airway Placement Confirmation: ETT inserted through vocal cords under direct vision,  positive ETCO2 and breath sounds checked- equal and bilateral Secured at: 22 cm Tube secured with: Tape Dental Injury: Teeth and Oropharynx as per pre-operative assessment

## 2020-05-02 NOTE — Discharge Instructions (Addendum)
Post-surgical Instructions, Outpatient Surgery  You may expect to feel dizzy, weak, and drowsy for as long as 24 hours after receiving the medicine that made you sleep (anesthetic). For the first 24 hours after your surgery:    Do not drive a car, ride a bicycle, participate in physical activities, or take public transportation until you are done taking narcotic pain medicines or as directed by Dr. Sabra Heck.   Do not drink alcohol or take tranquilizers.   Do not take medicine that has not been prescribed by your physicians.   Do not sign important papers or make important decisions while on narcotic pain medicines.   Have a responsible person with you.   PAIN MANAGEMENT  Motrin 800mg .  (This is the same as 4-200mg  over the counter tablets of Motrin or ibuprofen.)  You may take this every eight hours or as needed for cramping.    Vicodin 5/325mg .  For more severe pain, take one or two tablets every four to six hours as needed for pain control.  (Remember that narcotic pain medications increase your risk of constipation.  If this becomes a problem, you may take an over the counter stool softener like Colace 100mg  up to four times a day.)  DO'S AND DON'T'S  Do not take a tub bath for one week.  You may shower on the first day after your surgery  Do not do any heavy lifting for one to two weeks.  This increases the chance of bleeding.  Do move around as you feel able.  Stairs are fine.  You may begin to exercise again as you feel able.  Do not lift any weights for two weeks.  Do not put anything in the vagina for two weeks--no tampons, intercourse, or douching.    REGULAR MEDIATIONS/VITAMINS:  You may restart all of your regular medications as prescribed.  You may restart all of your vitamins as you normally take them.    The aygestin--continue 15mg  twice daily for at two weeks.  Then decrease for one week to 15mg  in the am and 10mg  in the pm.  Then one week later decrease to 10mg  in  the am and 10mg  in the PM.  Then decrease to 10mg  in the am and 5mg  in the pm for one week.  Then decrease to 5mg  in the AM and 5mg  in the PM for 1 week.  Continue this dosing until you see me.  Call with any increase in bleeding.  Please make sure to call if you need a refill and don't wait until you are on the last tablet.  Thanks.   PLEASE CALL OR SEEK MEDICAL CARE IF:  You have persistent nausea and vomiting.   You have trouble eating or drinking.   You have an oral temperature above 100.5.   You have constipation that is not helped by adjusting diet or increasing fluid intake. Pain medicines are a common cause of constipation.   You have heavy vaginal bleeding

## 2020-05-03 ENCOUNTER — Telehealth: Payer: Self-pay

## 2020-05-03 LAB — SURGICAL PATHOLOGY

## 2020-05-03 NOTE — Telephone Encounter (Signed)
Spoke to pt. Pt given results and recommendations per Dr Sabra Heck. Pt agreeable and verbalized understanding. Pt scheduled now for 1 month follow up on 06/02/2020 at 1pm. Pt agreeable to date and time of appt.   Routing to Dr Sabra Heck for review.  Encounter closed.

## 2020-05-03 NOTE — Telephone Encounter (Signed)
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  She is going to need a recheck in about a month if her bleeding is under control.  Has a 2 week follow up but I want to change that.  Can you please?  Thanks.

## 2020-05-19 ENCOUNTER — Ambulatory Visit: Payer: 59 | Admitting: Obstetrics & Gynecology

## 2020-05-30 ENCOUNTER — Telehealth: Payer: Self-pay

## 2020-05-30 ENCOUNTER — Encounter: Payer: Self-pay | Admitting: Obstetrics & Gynecology

## 2020-05-30 NOTE — Telephone Encounter (Signed)
Attachments  [id number]  [id number]  [id number]   Gunnar Bulla "[name]"  P [facility] Hi Dr Sabra Heck. Because I was on intermittent fmla if im out 5 consecutive days or more my job automatically pays me for short term disability for the time i was out for the surgery. So they are needing you to fill out another form for me.  I have returned back to work May 28th, 2021.  I will send the forms to you, i have to turn them in by June 22nd. Let me know if you have any further questions.  For question #8 Yes, unable to sit up in a chair for long periods sometimes  For Question #17 about hospital visits, i went to [place]

## 2020-05-30 NOTE — Progress Notes (Deleted)
GYNECOLOGY  VISIT  CC:   ***  HPI: 39 y.o. G94P0020 Single Black or [place] American female here for 72mth follow up. Marland Kitchen  GYNECOLOGIC HISTORY: No LMP recorded. (Menstrual status: Other). Contraception: *** Menopausal hormone therapy: ***  Patient Active Problem List   Diagnosis Date Noted  . Menorrhagia with irregular cycle 04/25/2020  . Smoker 04/25/2020  . Adenomyoma 01/08/2020  . History of major depression 03/05/2019  . Essential hypertension 03/05/2019  . PCOS (polycystic ovarian syndrome) 03/05/2019    Past Medical History:  Diagnosis Date  . Allergy   . Anemia   . Anxiety   . Arthritis   . Depression   . GERD (gastroesophageal reflux disease)   . Hypertension   . Menorrhagia    adnomyosis  . Migraines   . PCOS (polycystic ovarian syndrome)   . STD (sexually transmitted disease)    trichomonas    Past Surgical History:  Procedure Laterality Date  . biopsy of cervix    . DILATATION & CURETTAGE/HYSTEROSCOPY WITH MYOSURE N/A 05/02/2020   Procedure: DILATATION & CURETTAGE/HYSTEROSCOPY WITH MYOSURE;  Surgeon: Megan Salon, MD;  Location: [place];  Service: Gynecology;  Laterality: N/A;  . INTRAUTERINE DEVICE (IUD) INSERTION N/A 05/02/2020   Procedure: INTRAUTERINE DEVICE (IUD) INSERTION/MIRENA IUD;  Surgeon: Megan Salon, MD;  Location: [place];  Service: Gynecology;  Laterality: N/A;  Mirena IUD  . LUMBAR PUNCTURE    . TONSILLECTOMY    . WISDOM TOOTH EXTRACTION      MEDS:   Current Outpatient Medications on File Prior to Visit  Medication Sig Dispense Refill  . albuterol (VENTOLIN HFA) 108 (90 Base) MCG/ACT inhaler Inhale 2 puffs into the lungs every 4 (four) hours as needed for wheezing or shortness of breath. 18 g 0  . hydrochlorothiazide (HYDRODIURIL) 25 MG tablet Take 25 mg by mouth daily.     Marland Kitchen HYDROcodone-acetaminophen (NORCO/VICODIN) 5-325 MG tablet Take 1-2 tablets by mouth every 6 (six) hours as needed for moderate pain. 20 tablet 0  . hydrOXYzine  (ATARAX/VISTARIL) 25 MG tablet Take 1 tablet (25 mg total) by mouth every 6 (six) hours as needed for itching. 12 tablet 0  . ibuprofen (ADVIL) 800 MG tablet Take 1 tablet (800 mg total) by mouth every 8 (eight) hours as needed. 30 tablet 0  . metFORMIN (GLUCOPHAGE) 500 MG tablet Take 1 tablet (500 mg total) by mouth daily with breakfast. 90 tablet 3  . norethindrone (AYGESTIN) 5 MG tablet Take 3 tablets (15 mg total) by mouth in the morning and at bedtime. (Patient taking differently: Take 5 mg by mouth 3 (three) times daily. ) 60 tablet 0  . phentermine (ADIPEX-P) 37.5 MG tablet Take 37.5 mg by mouth daily.     . sertraline (ZOLOFT) 100 MG tablet Take 1 tablet (100 mg total) by mouth daily. 30 tablet 2   No current facility-administered medications on file prior to visit.    ALLERGIES: Lisinopril and Mobic [meloxicam]  Family History  Problem Relation Age of Onset  . Hypertension Mother   . Hypertension Maternal Grandmother   . Heart attack Maternal Grandfather   . Breast cancer Paternal Grandmother     SH:  ***  Review of Systems  PHYSICAL EXAMINATION:    There were no vitals taken for this visit.    General appearance: alert, cooperative and appears stated age Neck: no adenopathy, supple, symmetrical, trachea midline and thyroid {CHL AMB PHY EX THYROID NORM [phone]::"normal to inspection and palpation"} CV:  {Exam;  heart brief:31539} Lungs:  {pe lungs ob:314451::"clear to auscultation, no wheezes, rales or rhonchi, symmetric air entry"} Breasts: {Exam; breast:13139::"normal appearance, no masses or tenderness"} Abdomen: soft, non-tender; bowel sounds normal; no masses,  no organomegaly Lymph:  no inguinal LAD noted  Pelvic: External genitalia:  no lesions              Urethra:  normal appearing urethra with no masses, tenderness or lesions              Bartholins and Skenes: normal                 Vagina: normal appearing vagina with normal color and discharge, no  lesions              Cervix: {CHL AMB PHY EX CERVIX NORM [phone]::"no lesions"}              Bimanual Exam:  Uterus:  {CHL AMB PHY EX UTERUS NORM [phone]::"normal size, contour, position, consistency, mobility, non-tender"}              Adnexa: {CHL AMB PHY EX ADNEXA NO MASS [phone]::"no mass, fullness, tenderness"}              Rectovaginal: {yes no:[id number]}.  Confirms.              Anus:  normal sphincter tone, no lesions  Chaperone, ***Terence Lux, CMA, was present for exam.  Assessment: ***  Plan: ***   ~{NUMBERS; -10-45 JOINT ROM:10287} minutes spent with patient >50% of time was in face to face discussion of above.

## 2020-05-30 NOTE — Telephone Encounter (Signed)
Forms printed.   Routing to [employer], Therapist, sports

## 2020-05-31 NOTE — Telephone Encounter (Signed)
Spoke with patient. Advise FMLA forms have been sent with confirmation to [place] and [facility] at [phone]. Encounter closed.

## 2020-05-31 NOTE — Telephone Encounter (Signed)
This has been signed and returned to you.  Thanks.

## 2020-05-31 NOTE — Telephone Encounter (Signed)
Spoke with patient. Patient states that since she was on intermittent FMLA prior to surgery when she had surgery she was placed on short term disability as she was out for more than 5 days. Patient states that her claim for intermittent FMLA is still open but that they need FMLA forms for when she was out after surgery. Forms complete and to Dr.Saunders for review before signing.

## 2020-06-02 ENCOUNTER — Ambulatory Visit: Payer: 59 | Admitting: Obstetrics & Gynecology

## 2020-06-02 NOTE — Progress Notes (Addendum)
GYNECOLOGY  VISIT  CC:   Follow up after hysteroscopy, IUD placement  HPI: 39 y.o. G31P0020 Single Black or African American female here for 1 month follow up after her hysteroscopy with IUD placement.  She had spotting for two weeks after the procedure.  She then had a cycle for about 5 days.  She's been spotting since then.  She didn't have any clotting with her cycle.  She's not having any cramping.  She is still having some intermittent pain.  She took ibuprofen and a hydrocodone.  Nothing really helped.  It stopped after 4 hours and she's not really sure what caused the pain to stop.  It is less frequent.  She's not tried to feel her IUD strings.      GYNECOLOGIC HISTORY: Patient's last menstrual period was 05/24/2020 (exact date). Contraception: iud Menopausal hormone therapy: none  Patient Active Problem List   Diagnosis Date Noted  . Menorrhagia with irregular cycle 04/25/2020  . Smoker 04/25/2020  . Adenomyoma 01/08/2020  . History of major depression 03/05/2019  . Essential hypertension 03/05/2019  . PCOS (polycystic ovarian syndrome) 03/05/2019    Past Medical History:  Diagnosis Date  . Allergy   . Anemia   . Anxiety   . Arthritis   . Depression   . GERD (gastroesophageal reflux disease)   . Hypertension   . Menorrhagia    adnomyosis  . Migraines   . PCOS (polycystic ovarian syndrome)   . STD (sexually transmitted disease)    trichomonas    Past Surgical History:  Procedure Laterality Date  . biopsy of cervix    . DILATATION & CURETTAGE/HYSTEROSCOPY WITH MYOSURE N/A 05/02/2020   Procedure: DILATATION & CURETTAGE/HYSTEROSCOPY WITH MYOSURE;  Surgeon: Megan Salon, MD;  Location: [place];  Service: Gynecology;  Laterality: N/A;  . INTRAUTERINE DEVICE (IUD) INSERTION N/A 05/02/2020   Procedure: INTRAUTERINE DEVICE (IUD) INSERTION/MIRENA IUD;  Surgeon: Megan Salon, MD;  Location: [place];  Service: Gynecology;  Laterality: N/A;  Mirena IUD  . LUMBAR PUNCTURE    .  TONSILLECTOMY    . WISDOM TOOTH EXTRACTION      MEDS:   Current Outpatient Medications on File Prior to Visit  Medication Sig Dispense Refill  . albuterol (VENTOLIN HFA) 108 (90 Base) MCG/ACT inhaler Inhale 2 puffs into the lungs every 4 (four) hours as needed for wheezing or shortness of breath. 18 g 0  . hydrochlorothiazide (HYDRODIURIL) 25 MG tablet Take 25 mg by mouth daily.     . hydrOXYzine (ATARAX/VISTARIL) 25 MG tablet Take 1 tablet (25 mg total) by mouth every 6 (six) hours as needed for itching. 12 tablet 0  . ibuprofen (ADVIL) 800 MG tablet Take 1 tablet (800 mg total) by mouth every 8 (eight) hours as needed. 30 tablet 0  . metFORMIN (GLUCOPHAGE) 500 MG tablet Take 1 tablet (500 mg total) by mouth daily with breakfast. 90 tablet 3  . norethindrone (AYGESTIN) 5 MG tablet Take 3 tablets (15 mg total) by mouth in the morning and at bedtime. (Patient taking differently: Take 5 mg by mouth 3 (three) times daily. ) 60 tablet 0  . phentermine (ADIPEX-P) 37.5 MG tablet Take 37.5 mg by mouth daily.     . sertraline (ZOLOFT) 100 MG tablet Take 1 tablet (100 mg total) by mouth daily. 30 tablet 2  . traZODone (DESYREL) 100 MG tablet Take by mouth.     No current facility-administered medications on file prior to visit.    ALLERGIES:  Lisinopril and Mobic [meloxicam]  Family History  Problem Relation Age of Onset  . Hypertension Mother   . Hypertension Maternal Grandmother   . Heart attack Maternal Grandfather   . Breast cancer Paternal Grandmother     SH:  Single, non smoker  Review of Systems  Constitutional: Negative.   HENT: Negative.   Eyes: Negative.   Respiratory: Negative.   Cardiovascular: Negative.   Gastrointestinal: Negative.   Endocrine: Negative.   Genitourinary: Negative.   Musculoskeletal: Negative.   Skin: Negative.   Allergic/Immunologic: Negative.   Neurological: Negative.   Hematological: Negative.   Psychiatric/Behavioral: Negative.     PHYSICAL  EXAMINATION:    BP 118/70   Pulse 68   Temp (!) 97.2 F (36.2 C) (Skin)   Resp 16   Wt 215 lb (97.5 kg)   LMP 05/24/2020 (Exact Date)   BMI 32.69 kg/m     General appearance: alert, cooperative and appears stated age Abdomen: soft, non-tender; bowel sounds normal; no masses,  no organomegaly Lymph:  no inguinal LAD noted  Pelvic: External genitalia:  no lesions              Urethra:  normal appearing urethra with no masses, tenderness or lesions              Bartholins and Skenes: normal                 Vagina: normal appearing vagina with normal color and discharge, no lesions              Cervix: no lesions, IUD string noted              Bimanual Exam:  Uterus:  enlarged, 10-12 weeks weeks size              Adnexa: no mass, fullness, tenderness  Chaperone, Terence Lux, CMA, was present for exam.  Assessment: Menorrhagia with dysmenorrhea Adenomyosis/adenomyoma   Plan: Recheck 3 months Rx for tramadol 50mg , 1-2 tabs every 6 hours prn.  #30/0RF

## 2020-06-03 ENCOUNTER — Ambulatory Visit (INDEPENDENT_AMBULATORY_CARE_PROVIDER_SITE_OTHER): Payer: 59 | Admitting: Obstetrics & Gynecology

## 2020-06-03 ENCOUNTER — Other Ambulatory Visit: Payer: Self-pay

## 2020-06-03 ENCOUNTER — Encounter: Payer: Self-pay | Admitting: Obstetrics & Gynecology

## 2020-06-03 VITALS — BP 118/70 | HR 68 | Temp 97.2°F | Resp 16 | Wt 215.0 lb

## 2020-06-03 DIAGNOSIS — N921 Excessive and frequent menstruation with irregular cycle: Secondary | ICD-10-CM

## 2020-06-03 DIAGNOSIS — D369 Benign neoplasm, unspecified site: Secondary | ICD-10-CM

## 2020-06-03 MED ORDER — TRAMADOL HCL 50 MG PO TABS: 50.0000 mg | ORAL_TABLET | Freq: Four times a day (QID) | ORAL | 0 refills | Status: DC | PRN

## 2020-06-17 ENCOUNTER — Other Ambulatory Visit: Payer: Self-pay | Admitting: Obstetrics & Gynecology

## 2020-06-21 ENCOUNTER — Telehealth: Payer: Self-pay | Admitting: Obstetrics & Gynecology

## 2020-06-21 ENCOUNTER — Encounter: Payer: Self-pay | Admitting: Obstetrics & Gynecology

## 2020-06-21 DIAGNOSIS — D369 Benign neoplasm, unspecified site: Secondary | ICD-10-CM

## 2020-06-21 DIAGNOSIS — N921 Excessive and frequent menstruation with irregular cycle: Secondary | ICD-10-CM

## 2020-06-21 MED ORDER — IBUPROFEN 800 MG PO TABS: 800.0000 mg | ORAL_TABLET | Freq: Three times a day (TID) | ORAL | 1 refills | Status: DC | PRN

## 2020-06-21 NOTE — Telephone Encounter (Signed)
We could consider repeating her ultrasound to check and see size of adenomyoma since stopping Freida Busman and to see if IUD is in correct position.  Also, this was placed mid May so ok to be off aygestin at this point.  Ok to continue TID Ibuprofen.  Please remind her if she could stop smoking, I could put her back on the OCP she used to take and that seemed to control all of these issues.  I cannot as she is >35 and still smoking.    I am concerned I do not have other options for her.  Would she consider a second opinion at [facility] or [place]?

## 2020-06-21 NOTE — Telephone Encounter (Signed)
H/O PCOS  H/O Endometrial benign polyp  Last OV 06/03/20  Spoke with pt. Pt states having worsening pelvic pain and still bleeding/spotting since IUD insertion from May 2021 after D& C hysteroscopy. Pt states is taking Ibuprofen and Tylenol and Tramadol at night and in the last 2-3 weeks has not resolved pain.  Pt rates pain from 9-10 on pain scale. Pt states is causing her to not be able to function and be late to work. Pt states is tired of wearing pad or panty liner every day due to not knowing how bleeding will be. Pt states changing a pad or panty liner every 4-5 hours every day for the last 30 days. Pt states bleeding/spotting did stop about 2 weeks post op and pt states ran out of Aygestin Rx yesterday.   Pt concerned about taking Ibuprofen around the clock due to having hypertension.  Pt wanting to know what else to do for pain management and if needs refill on Aygestin?  Pt still unsure about future pregnancy or not. Cancelled referral appt with Dr Tamala Fothergill in May 2021 due to not with partner any longer. Pt wanting to know if needs another referral for future possibility of IVF?   Advised will review with Dr Sabra Heck and return call to pt with recommendations/advice. Pt agreeable.   Routing to Dr Sabra Heck.

## 2020-06-21 NOTE — Telephone Encounter (Signed)
Patient sent the following message via MyChart  Hi Dr Sabra Heck. I'm having pain more frequently now its every day. But would it be better for me to take Tylenol instead of the ibuprofen? I've been taking it every single day but i have high BP and was told that its not good to take that everyday since I do have it. Is this true? I take 4 ibuprofen, 1 Tylenol and 1 or 2 Tramadol and I'm still hurting. I really don't  know what its going to take. I have a heating pad on right now. But its made me late for work today because I didn't get any sleep at all last night.  Im trying to give things some time bc I know it probably takes a while to possibly work but technically I've been in pain and bleeding since December so it just seems like forever to me even though I just started the IUD in May.

## 2020-06-21 NOTE — Telephone Encounter (Signed)
Medication refill request: Ibuprofen 800mg   Last AEX:  02/08/20 Next AEX: 05/05/21 Last MMG (if hormonal medication request): NA Refill authorized: #30 with 0rf

## 2020-06-22 ENCOUNTER — Telehealth: Payer: Self-pay | Admitting: Obstetrics & Gynecology

## 2020-06-22 NOTE — Telephone Encounter (Signed)
Cc: [name] for precert. Orders placed.

## 2020-06-22 NOTE — Telephone Encounter (Signed)
Call to patient. Per DPR, OK to leave message on voicemail.   Left detailed voicemail regarding benefits for scheduled Pelvic ultrasound with Felipa Emory, MD. Encounter closed.

## 2020-06-22 NOTE — Telephone Encounter (Signed)
Spoke with pt. Pt given update and recommendations per Dr Sabra Heck. Pt states would like to repeat PUS. Pt states pain yesterday after we talked went from right side of abd down leg and "felt numb" for a few seconds in foot. Pt states then pain was gone for couple of hours, then came back last night.   Pt states willing to stop smoking. Pt states has a program at her job for smoking cessation and will start that program by this week. Pt states have decreased smoking cigarettes to 4 a day.   Pt advised will give update to Dr Sabra Heck. Pt agreeable. Pt scheduled for PUS with next available spot on 7/22 at 330pm. Pt agreeable and verbalized understanding to date and time of appt.   Routing to Dr Sabra Heck.  Encounter closed.

## 2020-06-23 ENCOUNTER — Telehealth: Payer: Self-pay | Admitting: Obstetrics & Gynecology

## 2020-06-23 ENCOUNTER — Telehealth: Payer: Self-pay

## 2020-06-23 ENCOUNTER — Encounter: Payer: Self-pay | Admitting: Obstetrics & Gynecology

## 2020-06-23 NOTE — Telephone Encounter (Signed)
Patient wants to speak with the nurse no information given. °

## 2020-06-23 NOTE — Telephone Encounter (Signed)
Spoke with pt. Pt states having shooting nerve pain down legs and knees intermittently. Pt states has been going on for 1 month, but getting more frequent. Pt denies any numbness or deficits. Pt advised to have PUS moved up. Pt scheduled PUS now for 7/15 at 1230 pm. Pt agreeable and verbalized understanding.  Pt advised that I will review with Dr Sabra Heck and return call to pt with any additional recommendations. Pt agreeable.   Routing to Dr Sabra Heck.

## 2020-06-23 NOTE — Telephone Encounter (Signed)
Jamie Savers [name]  [facility] Hi can someone give me a call. I keep having shooting pains coming from my pelvic area down to my knee. So far they come and go on and off and each time last about 3-6 minutes. And it switches sides. Its feels like it may be some kind of nerve pain.

## 2020-06-23 NOTE — Telephone Encounter (Signed)
Pt sent Mychart message:     Jamie Chung, Jamie Chung "Jamie Chung"  [facility] Hi can someone give me a call. I keep having shooting pains coming from my pelvic area down to my knee. So far they come and go on and off and each time last about 3-6 minutes. And it switches sides. Its feels like it may be some kind of nerve pain.

## 2020-06-23 NOTE — Telephone Encounter (Signed)
Duplicate encounter. Encounter closed.

## 2020-06-26 ENCOUNTER — Encounter: Payer: Self-pay | Admitting: Obstetrics & Gynecology

## 2020-06-27 ENCOUNTER — Telehealth: Payer: Self-pay | Admitting: *Deleted

## 2020-06-27 MED ORDER — NORETHINDRONE ACETATE 5 MG PO TABS: 5.0000 mg | ORAL_TABLET | Freq: Two times a day (BID) | ORAL | 0 refills | Status: DC

## 2020-06-27 NOTE — Telephone Encounter (Signed)
Ok to restart the norethindrone 5mg  bid.  Please send rx to pharmacy.  Can we see if can get Depo Lupron covered for her?  She has indicated she would still like the opportunity to have children and I'd like to give her a little more time if possible.  Thanks.

## 2020-06-27 NOTE — Telephone Encounter (Signed)
Spoke with patient. Advised per Dr. Sabra Heck. Rx for Norethindrone to verified pharmacy.  Patient agreeable to proceed with benefits review for Depo-Lupron, will plan to further discuss at [facility] on 7/15. Patient verbalizes understanding and is agreeable.   Referral completed and faxed to [employer] for Depo Lupron 11.25 mg IM q3 months. Refill: one

## 2020-06-27 NOTE — Telephone Encounter (Signed)
Spoke with patient. Patient reports bleeding has slowed down, changing a pad "every few hours". Denies any signs/symptoms of anemia. Patient states she stopped Norethindrone on 06/21/20 when her prescription ran out. Patient is requesting to proceed with a hysterectomy.   Patient previously scheduled for PUS on 06/30/20 at 12:30pm with Dr. Sabra Heck. Advised patient to keep PUS as scheduled, can further discuss treatment options during consult. Advised patient to return call to office if bleeding becomes heavy or any new symptoms develop. Advised patient I will provide update to Dr. Sabra Heck and f/u with any additional recommendations. Advised patient Dr. Sabra Heck is in the OR, response may not be immediate. Patient verbalizes understanding and is agreeable.   Routing to Dr. Sabra Heck for final review.

## 2020-06-27 NOTE — Telephone Encounter (Signed)
[name]  [facility] So now that im not taking the Norethidrone i have been bleeding heavily. Messing up my clothes and went through my clothes and saturated my bed. At this point i cannot deal with anymore stress. At the end of the day I still have to work and Ive missed work bc of the pains I keep having. Its too much to deal with when it starts effecting my daily life. As much as I want to want to keep exploring other options, mentally and physically I don't think I can anymore. I appreciate all you are doing to try to make things better for me. But its too stressful to deal with. I still have to push forward and if I keep going through this, its going to be tougher for me. I literally sit at home with heating pads day in and day out. Im tired of swallowing pains pills all day long. This new life for me is too much and I cannot deal with it. I want to schedule a hysterectomy if possible Dr. Sabra Heck. I feel like I have no choice at this point. If later i decide I want a child, theres other options but  at this point its not even a goal of mine to achieve due to the severe complications if i wanted to have one naturally. Again, I really appreciate all you have done. But i have to keep my sanity and stay stress free due to my hypertension issues. You or the nurse can give me a call tomorrow if you need to. But this is just where im at with things and Im not changing my mindeven though I dont want to go through amy major surgery but its for the best..

## 2020-06-28 NOTE — Telephone Encounter (Signed)
Benefits review completed by [employer]  Depo-Lupron 11.25 mg covered under patients medical benefits.  No coverage under pharmacy benefits. Referral has been sent to [facility]  Routing to Dr. Lestine Box.

## 2020-06-30 ENCOUNTER — Other Ambulatory Visit: Payer: Self-pay

## 2020-06-30 ENCOUNTER — Other Ambulatory Visit: Payer: Self-pay | Admitting: Obstetrics & Gynecology

## 2020-06-30 NOTE — Telephone Encounter (Addendum)
Patient missed her 12:30pm PUS appointment today. I spoke to the patient and she said " I spoke with some in the office and was told to arrive at 3:15pm for a 3:30pm.". I told her I would need to have a nurse return her call to reschedule". She is available to come in today. [facility] policy?

## 2020-07-01 NOTE — Telephone Encounter (Signed)
Follow office policy.  Routing to [employer] as well.

## 2020-07-04 NOTE — Telephone Encounter (Signed)
Call to patient. Left message to call back.

## 2020-07-07 ENCOUNTER — Other Ambulatory Visit: Payer: Self-pay

## 2020-07-07 ENCOUNTER — Other Ambulatory Visit: Payer: Self-pay | Admitting: Obstetrics & Gynecology

## 2020-07-12 NOTE — Telephone Encounter (Signed)
[facility] called to confirm that we have received the Depo-Lupron. I relayed the message from Sharee Pimple that the patient will not proceed with the Depo-Lupron. The rep said "I will note the patients account and to call if you have any questions".

## 2020-07-18 NOTE — Telephone Encounter (Signed)
Spoke with patient.   PUS r/s to 08/04/20 at 3:30pm, consult to follow at 4pm w/ Dr. Sabra Heck. Patient is aware to arrive at 3:15pm and is aware of cancellation policy.   Provided update on Depo-Lupron, patient will discuss alternative tx options with Dr. Sabra Heck during consult.   She is still taking Aygestin 5 mg bid, will continue until PUS/Consult, will need refill. Rx pended. Patient will continue to calendar bleeding and call if any concerns.   Patient thankful for call.   Routing to Dr. Sabra Heck for refill request.    Cc: Lamont Snowball, RN, [name]

## 2020-07-19 MED ORDER — NORETHINDRONE ACETATE 5 MG PO TABS: 5.0000 mg | ORAL_TABLET | Freq: Two times a day (BID) | ORAL | 1 refills | Status: DC

## 2020-07-19 NOTE — Telephone Encounter (Signed)
MyChart message to patient notifying of refill.   Encounter closed.

## 2020-07-19 NOTE — Telephone Encounter (Signed)
Rx completed.  Thank you for getting her rescheduled.  Ok to close encounter.

## 2020-08-04 ENCOUNTER — Encounter: Payer: Self-pay | Admitting: Obstetrics & Gynecology

## 2020-08-04 ENCOUNTER — Ambulatory Visit (INDEPENDENT_AMBULATORY_CARE_PROVIDER_SITE_OTHER): Payer: 59

## 2020-08-04 ENCOUNTER — Ambulatory Visit (INDEPENDENT_AMBULATORY_CARE_PROVIDER_SITE_OTHER): Payer: 59 | Admitting: Obstetrics & Gynecology

## 2020-08-04 ENCOUNTER — Other Ambulatory Visit: Payer: Self-pay

## 2020-08-04 VITALS — BP 152/80 | HR 115 | Resp 12 | Ht 66.0 in | Wt 206.2 lb

## 2020-08-04 DIAGNOSIS — D369 Benign neoplasm, unspecified site: Secondary | ICD-10-CM

## 2020-08-04 DIAGNOSIS — N921 Excessive and frequent menstruation with irregular cycle: Secondary | ICD-10-CM

## 2020-08-04 DIAGNOSIS — Z8742 Personal history of other diseases of the female genital tract: Secondary | ICD-10-CM

## 2020-08-04 DIAGNOSIS — T8332XA Displacement of intrauterine contraceptive device, initial encounter: Secondary | ICD-10-CM

## 2020-08-04 NOTE — Progress Notes (Signed)
39 y.o. G63P0020 Single Black or African American female here for pelvic ultrasound due to h/o atypical pelvic, shooting leg pain that has occurred over the summer.  However, this seems to have resolved and she is no longer have any bleeding or clotting.  Has not had any bleeding in 3 weeks.  Due to pain, ultrasound was recommended..  Contraception: IUD  Findings:  UTERUS: 8.7 x 6.8 x 5.9cm with 1.9cm intramural fibroid and area of adenomyosis measuring about 4+cm today, IUD is displaced into cervical canal EMS:4.28mm ADNEXA: Left ovary: 2.7 x 2.3 x 1.6cm       Right ovary: 2.5 x 2.1 x 1.7cm CUL DE SAC: no free fluid  Discussion:  Ultrasonographer supervised.  Ultrasound findings reviewed with pt.  Removal of IUD recommended.  Pt advised this was not providing contraception and was infection risk if remained in place.  Consented verbally to removal  Speculum placed.  Cervix visualized and IUD string identified.  Part of stem of IUD visualized at os.  IUD removed with one pull.  Pt did have significant cramp with removal that resolved very quickly.  She did kick me in the check with removal but otherwise tolerated procedure fine.  Speculum removed.  Assessment:  Adenomyosis <2cm intramural fibroid IUD in cervical canal, s/p removal today  Plan:  Will stay on norethindrone for bleeding control.  Pt knows this is not officially contraception but is same medication as in micronor just higher dosage.  Condom use endorsed.  Pt trying to quite smoking so can go back to prior combination OCP.  In additional to IUD removal, about 20 minutes spent with pt reviewing ultrasound results and discussing symptoms over the past few months before IUD removal was performed.

## 2020-08-12 ENCOUNTER — Telehealth: Payer: Self-pay | Admitting: Obstetrics & Gynecology

## 2020-08-12 ENCOUNTER — Ambulatory Visit
Admission: RE | Admit: 2020-08-12 | Discharge: 2020-08-12 | Disposition: A | Discharge status: Home / Self Care | Payer: 59 | Source: Ambulatory Visit | Attending: Nurse Practitioner | Admitting: Nurse Practitioner

## 2020-08-12 ENCOUNTER — Other Ambulatory Visit: Payer: Self-pay

## 2020-08-12 ENCOUNTER — Telehealth: Payer: 59 | Admitting: Family

## 2020-08-12 DIAGNOSIS — N898 Other specified noninflammatory disorders of vagina: Secondary | ICD-10-CM

## 2020-08-12 NOTE — Telephone Encounter (Signed)
Patient thinks she has a yeast infection and would like an appointment today?

## 2020-08-12 NOTE — Progress Notes (Deleted)
GYNECOLOGY  VISIT  CC:   ***  HPI: 39 y.o. G75P0020 Single Black or African American female here for vaginitis.  GYNECOLOGIC HISTORY: No LMP recorded. Contraception: *** Menopausal hormone therapy: ***  Patient Active Problem List   Diagnosis Date Noted  . Menorrhagia with irregular cycle 04/25/2020  . Smoker 04/25/2020  . Adenomyoma 01/08/2020  . History of major depression 03/05/2019  . Essential hypertension 03/05/2019  . PCOS (polycystic ovarian syndrome) 03/05/2019    Past Medical History:  Diagnosis Date  . Allergy   . Anemia   . Anxiety   . Arthritis   . Depression   . GERD (gastroesophageal reflux disease)   . Hypertension   . Menorrhagia    adnomyosis  . Migraines   . PCOS (polycystic ovarian syndrome)   . STD (sexually transmitted disease)    trichomonas    Past Surgical History:  Procedure Laterality Date  . biopsy of cervix    . DILATATION & CURETTAGE/HYSTEROSCOPY WITH MYOSURE N/A 05/02/2020   Procedure: DILATATION & CURETTAGE/HYSTEROSCOPY WITH MYOSURE;  Surgeon: Megan Salon, MD;  Location: [place];  Service: Gynecology;  Laterality: N/A;  . INTRAUTERINE DEVICE (IUD) INSERTION N/A 05/02/2020   Procedure: INTRAUTERINE DEVICE (IUD) INSERTION/MIRENA IUD;  Surgeon: Megan Salon, MD;  Location: [place];  Service: Gynecology;  Laterality: N/A;  Mirena IUD  . LUMBAR PUNCTURE    . TONSILLECTOMY    . WISDOM TOOTH EXTRACTION      MEDS:   Current Outpatient Medications on File Prior to Visit  Medication Sig Dispense Refill  . albuterol (VENTOLIN HFA) 108 (90 Base) MCG/ACT inhaler Inhale 2 puffs into the lungs every 4 (four) hours as needed for wheezing or shortness of breath. 18 g 0  . hydrochlorothiazide (HYDRODIURIL) 25 MG tablet Take 25 mg by mouth daily.     . hydrOXYzine (ATARAX/VISTARIL) 25 MG tablet Take 1 tablet (25 mg total) by mouth every 6 (six) hours as needed for itching. 12 tablet 0  . ibuprofen (ADVIL) 800 MG tablet Take 1 tablet (800 mg total)  by mouth every 8 (eight) hours as needed. 30 tablet 1  . metFORMIN (GLUCOPHAGE) 500 MG tablet Take 1 tablet (500 mg total) by mouth daily with breakfast. 90 tablet 3  . norethindrone (AYGESTIN) 5 MG tablet Take 1 tablet (5 mg total) by mouth in the morning and at bedtime. 60 tablet 1  . phentermine (ADIPEX-P) 37.5 MG tablet Take 37.5 mg by mouth daily.     . sertraline (ZOLOFT) 100 MG tablet Take 1 tablet (100 mg total) by mouth daily. 30 tablet 2  . traMADol (ULTRAM) 50 MG tablet Take 1 tablet (50 mg total) by mouth every 6 (six) hours as needed. Can take 2 tablets every 6 hours for a max dose of 400mg /day, if needed. 30 tablet 0  . traZODone (DESYREL) 100 MG tablet Take by mouth.     No current facility-administered medications on file prior to visit.    ALLERGIES: Lisinopril and Mobic [meloxicam]  Family History  Problem Relation Age of Onset  . Hypertension Mother   . Hypertension Maternal Grandmother   . Heart attack Maternal Grandfather   . Breast cancer Paternal Grandmother     SH:  ***  Review of Systems  PHYSICAL EXAMINATION:    There were no vitals taken for this visit.    General appearance: alert, cooperative and appears stated age Neck: no adenopathy, supple, symmetrical, trachea midline and thyroid {CHL AMB PHY EX THYROID NORM  DEFAULT:[phone]::"normal to inspection and palpation"} CV:  {Exam; heart brief:31539} Lungs:  {pe lungs ob:314451::"clear to auscultation, no wheezes, rales or rhonchi, symmetric air entry"} Breasts: {Exam; breast:13139::"normal appearance, no masses or tenderness"} Abdomen: soft, non-tender; bowel sounds normal; no masses,  no organomegaly Lymph:  no inguinal LAD noted  Pelvic: External genitalia:  no lesions              Urethra:  normal appearing urethra with no masses, tenderness or lesions              Bartholins and Skenes: normal                 Vagina: normal appearing vagina with normal color and discharge, no lesions               Cervix: {CHL AMB PHY EX CERVIX NORM [phone]::"no lesions"}              Bimanual Exam:  Uterus:  {CHL AMB PHY EX UTERUS NORM DEFAULT:[phone]::"normal size, contour, position, consistency, mobility, non-tender"}              Adnexa: {CHL AMB PHY EX ADNEXA NO MASS DEFAULT:[phone]::"no mass, fullness, tenderness"}              Rectovaginal: {yes no:[phone]}.  Confirms.              Anus:  normal sphincter tone, no lesions  Chaperone, ***Terence Lux, CMA, was present for exam.  Assessment: ***  Plan: ***   ~{NUMBERS; -10-45 JOINT ROM:10287} minutes spent with patient >50% of time was in face to face discussion of above.

## 2020-08-12 NOTE — Progress Notes (Signed)
Based on what you shared with me, I feel your condition warrants further evaluation and I recommend that you be seen for a face to face office visit.  After reviewing your chart it looks like you just had an IUD removed, I would recommend following up with your GYN  for testing this could be an infection or yeast.   NOTE: If you entered your credit card information for this eVisit, you will not be charged. You may see a "hold" on your card for the $35 but that hold will drop off and you will not have a charge processed.   If you are having a true medical emergency please call 911.      For an urgent face to face visit, [place] has five urgent care centers for your convenience:      NEW:  [facility] [place] at [place] Get Driving Directions [phone] [place], [place] [address] . 10 am - 6pm Monday - Friday    [facility] [place] [facility]) Get Driving Directions [phone] [address] [place], [place] [address] . 10 am to 8 pm Monday-Friday . 12 pm to 8 pm [facility] at [facility] Get Driving Directions [phone] [place], [place], [place] [address] . 8 am to 8 pm Monday-Friday . 9 am to 6 pm Saturday . 11 am to 6 pm Sunday     [facility] at [facility] Get Driving Directions  [phone] [address] [place], [place] [address] . 8 am to 8 pm Monday-Friday . 8 am to 4 pm [facility] at [place] Get Driving Directions [phone] [place] [place],  [address] . 12 pm to 6 pm Monday-Friday      Your e-visit answers were reviewed by a board certified advanced clinical practitioner to complete your personal care plan.  Thank you for using e-Visits.    Approximately 5 minutes was spent documenting and reviewing patient's chart.

## 2020-08-12 NOTE — Telephone Encounter (Signed)
Spoke with pt. Pt states having yeast sx of vaginal itching with white discharge x 2 days now. Pt denies vaginal odor, fever, chills, all UTI sx.  Pt requesting to be seen today. Pt advised no appts available today. Pt advised can use OTC monistat 3 over weekend and call with unresolved sx on Monday. Pt states would like to make appt with Dr Sabra Heck on Monday, unsure if she will use OTC monistat. Pt also advised can go to PCP or Urgent care. Pt verbalized understanding.  Pt scheduled with Dr Sabra Heck on 8/30 at 10 am. Pt agreeable to date and time of appt.  Encounter closed.

## 2020-08-15 ENCOUNTER — Other Ambulatory Visit: Payer: Self-pay

## 2020-08-15 ENCOUNTER — Ambulatory Visit (INDEPENDENT_AMBULATORY_CARE_PROVIDER_SITE_OTHER): Payer: 59 | Admitting: Obstetrics and Gynecology

## 2020-08-15 ENCOUNTER — Ambulatory Visit: Payer: Self-pay | Admitting: Obstetrics & Gynecology

## 2020-08-15 ENCOUNTER — Encounter: Payer: Self-pay | Admitting: Obstetrics and Gynecology

## 2020-08-15 VITALS — BP 140/78 | HR 118 | Ht 68.0 in | Wt 207.0 lb

## 2020-08-15 DIAGNOSIS — N760 Acute vaginitis: Secondary | ICD-10-CM | POA: Diagnosis not present

## 2020-08-15 MED ORDER — METRONIDAZOLE 500 MG PO TABS
500.0000 mg | ORAL_TABLET | Freq: Two times a day (BID) | ORAL | 0 refills | Status: DC
Start: 2020-08-15 — End: 2020-09-15

## 2020-08-15 NOTE — Patient Instructions (Signed)
Vaginitis Vaginitis is a condition in which the vaginal tissue swells and becomes red (inflamed). This condition is most often caused by a change in the normal balance of bacteria and yeast that live in the vagina. This change causes an overgrowth of certain bacteria or yeast, which causes the inflammation. There are different types of vaginitis, but the most common types are:  Bacterial vaginosis.  Yeast infection (candidiasis).  Trichomoniasis vaginitis. This is a sexually transmitted disease (STD).  Viral vaginitis.  Atrophic vaginitis.  Allergic vaginitis. What are the causes? The cause of this condition depends on the type of vaginitis. It can be caused by:  Bacteria (bacterial vaginosis).  Yeast, which is a fungus (yeast infection).  A parasite (trichomoniasis vaginitis).  A virus (viral vaginitis).  Low hormone levels (atrophic vaginitis). Low hormone levels can occur during pregnancy, breastfeeding, or after menopause.  Irritants, such as bubble baths, scented tampons, and feminine sprays (allergic vaginitis). Other factors can change the normal balance of the yeast and bacteria that live in the vagina. These include:  Antibiotic medicines.  Poor hygiene.  Diaphragms, vaginal sponges, spermicides, birth control pills, and intrauterine devices (IUD).  Sex.  Infection.  Uncontrolled diabetes.  A weakened defense (immune) system. What increases the risk? This condition is more likely to develop in women who:  Smoke.  Use vaginal douches, scented tampons, or scented sanitary pads.  Wear tight-fitting pants.  Wear thong underwear.  Use oral birth control pills or an IUD.  Have sex without a condom.  Have multiple sex partners.  Have an STD.  Frequently use the spermicide nonoxynol-9.  Eat lots of foods high in sugar.  Have uncontrolled diabetes.  Have low estrogen levels.  Have a weakened immune system from an immune disorder or medical  treatment.  Are pregnant or breastfeeding. What are the signs or symptoms? Symptoms vary depending on the cause of the vaginitis. Common symptoms include:  Abnormal vaginal discharge. ? The discharge is white, gray, or yellow with bacterial vaginosis. ? The discharge is thick, white, and cheesy with a yeast infection. ? The discharge is frothy and yellow or greenish with trichomoniasis.  A bad vaginal smell. The smell is fishy with bacterial vaginosis.  Vaginal itching, pain, or swelling.  Sex that is painful.  Pain or burning when urinating. Sometimes there are no symptoms. How is this diagnosed? This condition is diagnosed based on your symptoms and medical history. A physical exam, including a pelvic exam, will also be done. You may also have other tests, including:  Tests to determine the pH level (acidity or alkalinity) of your vagina.  A whiff test, to assess the odor that results when a sample of your vaginal discharge is mixed with a potassium hydroxide solution.  Tests of vaginal fluid. A sample will be examined under a microscope. How is this treated? Treatment varies depending on the type of vaginitis you have. Your treatment may include:  Antibiotic creams or pills to treat bacterial vaginosis and trichomoniasis.  Antifungal medicines, such as vaginal creams or suppositories, to treat a yeast infection.  Medicine to ease discomfort if you have viral vaginitis. Your sexual partner should also be treated.  Estrogen delivered in a cream, pill, suppository, or vaginal ring to treat atrophic vaginitis. If vaginal dryness occurs, lubricants and moisturizing creams may help. You may need to avoid scented soaps, sprays, or douches.  Stopping use of a product that is causing allergic vaginitis. Then using a vaginal cream to treat the symptoms. Follow   these instructions at home: Lifestyle  Keep your genital area clean and dry. Avoid soap, and only rinse the area with  water.  Do not douche or use tampons until your health care provider says it is okay to do so. Use sanitary pads, if needed.  Do not have sex until your health care provider approves. When you can return to sex, practice safe sex and use condoms.  Wipe from front to back. This avoids the spread of bacteria from the rectum to the vagina. General instructions  Take over-the-counter and prescription medicines only as told by your health care provider.  If you were prescribed an antibiotic medicine, take or use it as told by your health care provider. Do not stop taking or using the antibiotic even if you start to feel better.  Keep all follow-up visits as told by your health care provider. This is important. How is this prevented?  Use mild, non-scented products. Do not use things that can irritate the vagina, such as fabric softeners. Avoid the following products if they are scented: ? Feminine sprays. ? Detergents. ? Tampons. ? Feminine hygiene products. ? Soaps or bubble baths.  Let air reach your genital area. ? Wear cotton underwear to reduce moisture buildup. ? Avoid wearing underwear while you sleep. ? Avoid wearing tight pants and underwear or nylons without a cotton panel. ? Avoid wearing thong underwear.  Take off any wet clothing, such as bathing suits, as soon as possible.  Practice safe sex and use condoms. Contact a health care provider if:  You have abdominal pain.  You have a fever.  You have symptoms that last for more than 2-3 days. Get help right away if:  You have a fever and your symptoms suddenly get worse. Summary  Vaginitis is a condition in which the vaginal tissue becomes inflamed.This condition is most often caused by a change in the normal balance of bacteria and yeast that live in the vagina.  Treatment varies depending on the type of vaginitis you have.  Do not douche, use tampons , or have sex until your health care provider approves. When  you can return to sex, practice safe sex and use condoms. This information is not intended to replace advice given to you by your health care provider. Make sure you discuss any questions you have with your health care provider. Document Revised: 11/15/2017 Document Reviewed: 01/08/2017 Elsevier Patient Education  2020 [employer]  

## 2020-08-15 NOTE — Progress Notes (Signed)
GYNECOLOGY  VISIT   HPI: 39 y.o.   Single Black or African American Not Hispanic or Latino  female   G2P0020 with Patient's last menstrual period was 08/11/2020.   here for vaginitis symptoms.  She underwent a hysteroscopy, D&C, Mirnea IUD insertion in May for AUB. The IUD was removed last week secondary to malposition.   She started bleeding again 3 days after IUD removal.  She says that she just has not been feeling right.   She c/o mild vaginal irritation and itching since last week, no odor, no discharge. She started bleeding 2-3 days ago. No pain. She is changing her pad every 5 hours.   GYNECOLOGIC HISTORY: Patient's last menstrual period was 08/11/2020. Contraception: Condoms  Menopausal hormone therapy: none         OB History    Gravida  2   Para  0   Term      Preterm      AB  2   Living  0     SAB  1   TAB  1   Ectopic      Multiple      Live Births                 Patient Active Problem List   Diagnosis Date Noted  . Menorrhagia with irregular cycle 04/25/2020  . Smoker 04/25/2020  . Adenomyoma 01/08/2020  . History of major depression 03/05/2019  . Essential hypertension 03/05/2019  . PCOS (polycystic ovarian syndrome) 03/05/2019    Past Medical History:  Diagnosis Date  . Allergy   . Anemia   . Anxiety   . Arthritis   . Depression   . GERD (gastroesophageal reflux disease)   . Hypertension   . Menorrhagia    adnomyosis  . Migraines   . PCOS (polycystic ovarian syndrome)   . STD (sexually transmitted disease)    trichomonas    Past Surgical History:  Procedure Laterality Date  . biopsy of cervix    . DILATATION & CURETTAGE/HYSTEROSCOPY WITH MYOSURE N/A 05/02/2020   Procedure: DILATATION & CURETTAGE/HYSTEROSCOPY WITH MYOSURE;  Surgeon: Megan Salon, MD;  Location: [facility];  Service: Gynecology;  Laterality: N/A;  . INTRAUTERINE DEVICE (IUD) INSERTION N/A 05/02/2020   Procedure: INTRAUTERINE DEVICE (IUD) INSERTION/MIRENA IUD;   Surgeon: Megan Salon, MD;  Location: [facility];  Service: Gynecology;  Laterality: N/A;  Mirena IUD  . LUMBAR PUNCTURE    . TONSILLECTOMY    . WISDOM TOOTH EXTRACTION      Current Outpatient Medications  Medication Sig Dispense Refill  . albuterol (VENTOLIN HFA) 108 (90 Base) MCG/ACT inhaler Inhale 2 puffs into the lungs every 4 (four) hours as needed for wheezing or shortness of breath. 18 g 0  . hydrochlorothiazide (HYDRODIURIL) 25 MG tablet Take 25 mg by mouth daily.     . hydrOXYzine (ATARAX/VISTARIL) 25 MG tablet Take 1 tablet (25 mg total) by mouth every 6 (six) hours as needed for itching. 12 tablet 0  . ibuprofen (ADVIL) 800 MG tablet Take 1 tablet (800 mg total) by mouth every 8 (eight) hours as needed. 30 tablet 1  . metFORMIN (GLUCOPHAGE) 500 MG tablet Take 1 tablet (500 mg total) by mouth daily with breakfast. 90 tablet 3  . norethindrone (AYGESTIN) 5 MG tablet Take 1 tablet (5 mg total) by mouth in the morning and at bedtime. 60 tablet 1  . sertraline (ZOLOFT) 100 MG tablet Take 1 tablet (100 mg total) by mouth  daily. 30 tablet 2  . traMADol (ULTRAM) 50 MG tablet Take 1 tablet (50 mg total) by mouth every 6 (six) hours as needed. Can take 2 tablets every 6 hours for a max dose of 400mg /day, if needed. 30 tablet 0  . traZODone (DESYREL) 100 MG tablet Take by mouth.    . metroNIDAZOLE (FLAGYL) 500 MG tablet Take 1 tablet (500 mg total) by mouth 2 (two) times daily. 14 tablet 0  . phentermine (ADIPEX-P) 37.5 MG tablet Take 37.5 mg by mouth daily.      No current facility-administered medications for this visit.   Flagyl script sent today  ALLERGIES: Lisinopril and Mobic [meloxicam]  Family History  Problem Relation Age of Onset  . Hypertension Mother   . Hypertension Maternal Grandmother   . Heart attack Maternal Grandfather   . Breast cancer Paternal Grandmother     Social History   Socioeconomic History  . Marital status: Single    Spouse name: Not on file  . Number  of children: Not on file  . Years of education: Not on file  . Highest education level: Not on file  Occupational History  . Not on file  Tobacco Use  . Smoking status: Current Every Day Smoker    Packs/day: 0.50    Years: 4.00    Pack years: 2.00    Types: Cigarettes  . Smokeless tobacco: Never Used  Vaping Use  . Vaping Use: Never used  Substance and Sexual Activity  . Alcohol use: Yes    Alcohol/week: 3.0 standard drinks    Types: 3 Standard drinks or equivalent per week    Comment: 3 x per week  . Drug use: No  . Sexual activity: Not Currently    Partners: Male    Birth control/protection: None  Other Topics Concern  . Not on file  Social History Narrative  . Not on file   Social Determinants of Health   Financial Resource Strain:   . Difficulty of Paying Living Expenses: Not on file  Food Insecurity:   . Worried About Charity fundraiser in the Last Year: Not on file  . Ran Out of Food in the Last Year: Not on file  Transportation Needs:   . Lack of Transportation (Medical): Not on file  . Lack of Transportation (Non-Medical): Not on file  Physical Activity:   . Days of Exercise per Week: Not on file  . Minutes of Exercise per Session: Not on file  Stress:   . Feeling of Stress : Not on file  Social Connections:   . Frequency of Communication with Friends and Family: Not on file  . Frequency of Social Gatherings with Friends and Family: Not on file  . Attends Religious Services: Not on file  . Active Member of Clubs or Organizations: Not on file  . Attends Archivist Meetings: Not on file  . Marital Status: Not on file  Intimate Partner Violence:   . Fear of Current or Ex-Partner: Not on file  . Emotionally Abused: Not on file  . Physically Abused: Not on file  . Sexually Abused: Not on file    Review of Systems  All other systems reviewed and are negative.   PHYSICAL EXAMINATION:    BP 140/78   Pulse (!) 118   Ht 5\' 8"  (1.727 m)   Wt  207 lb (93.9 kg)   LMP 08/11/2020   SpO2 99%   BMI 31.47 kg/m     General  appearance: alert, cooperative and appears stated age  Pelvic: External genitalia:  no lesions              Urethra:  normal appearing urethra with no masses, tenderness or lesions              Bartholins and Skenes: normal                 Vagina: normal appearing vagina with normal color and discharge, no lesions              Cervix: no lesions              Chaperone was present for exam.  Wet prep: ++ clue, no trich, few wbc KOH: no yeast PH: 5   ASSESSMENT Bacterial vaginitis Light bleeding has resumed since her IUD was removed last week.     PLAN Treat with Flagyl Will send nuswab for yeast secondary to the itching She is on Aygestin, will f/u with Dr Sabra Heck    CC: Dr Sabra Heck

## 2020-08-16 ENCOUNTER — Telehealth: Payer: Self-pay | Admitting: *Deleted

## 2020-08-16 ENCOUNTER — Encounter: Payer: Self-pay | Admitting: Obstetrics & Gynecology

## 2020-08-16 NOTE — Telephone Encounter (Signed)
Jamie Savers [name]  [facility] Hi Dr Sabra Heck. I believe I want to get the IUD again since it eventually stopped my constant bleeding. I started bleeding last Friday and its getting worst. Clots again and everything but no bad cramps or pain as of yet.    Patient was seen in office on 08/15/20 for vaginitis symptoms. Last OV with Dr. Sabra Heck on 08/04/20, malpositioned Mirena IUD removed.    Dr. Sabra Heck -please review, ok to proceed with Mirena IUD insertion?

## 2020-08-16 NOTE — Telephone Encounter (Signed)
See 08/16/20 telephone encounter.

## 2020-08-17 NOTE — Telephone Encounter (Signed)
Routing to [place] to call pt.  She is desirous of having IUD replaced.  With ultrasound, IUD was noted low in cervix.  I could see the end of the stem of the IUD on exam with speculum.  I removed it but she kicked me in the chest with removal so I am am not sure she will tolerated placement in the office.  I would recommend doing it with ultrasound guidance if that is what she wants.  She may want to have this done in the outpatient OR or not sure if we could use the procedure suite at the new [facility] [place] location off [address] with mobile anesthesia.  I would want to do either of these with ultrasound guidance.

## 2020-08-17 NOTE — Telephone Encounter (Signed)
Spoke with patient. Advised of message as seen below from [place]. Patient would like to proceed with having IUD placed under mobile anesthesia in the procedure suite at [facility]. Advised will contact their location and return call.  Spoke with [facility] and they do have a procedure suite but do not offer mobile anesthesia.   Routing to [place] for further recommendations.

## 2020-08-18 LAB — C ALBICANS + C GLABRATA, NAA
Candida albicans, NAA: NEGATIVE
Candida glabrata, NAA: NEGATIVE

## 2020-08-23 ENCOUNTER — Encounter: Payer: Self-pay | Admitting: Obstetrics & Gynecology

## 2020-08-23 ENCOUNTER — Telehealth: Payer: Self-pay | Admitting: Obstetrics & Gynecology

## 2020-08-23 NOTE — Telephone Encounter (Signed)
FMLA paperwork printed and placed on Dr. Ammie Ferrier desk to advise.

## 2020-08-23 NOTE — Telephone Encounter (Signed)
Patient sent the following message via MyChart.  They want me to get these filled out for some odd reason. I do remember having a dr appt on that day due to pains and stuff.they want me to have it in by the 12th. If u need help filling it out give me call.

## 2020-08-23 NOTE — Telephone Encounter (Signed)
The certificate of need has not been completed for this site yet so there is not access to mobile anesthesia.  [facility].  [facility] effective.  Not sure I can have this done with ultrasound at [facility].  Would want to schedule it with ultrasound.  Last procedure precert was not done early enough, just FYI.  Dx:  menorrhagia Adenomyosis Dysmenorrhea  I might need to put a letter this as well as pt kicked me with IUD removal that was almost completely out of the cervix.  I just do not think she could tolerate placement in office.

## 2020-08-23 NOTE — Telephone Encounter (Signed)
Spoke with patient. Patient states that she stopped bleeding 3 days ago and does not wish to proceed with having IUD inserted again at this time. States that her bleeding after IUD removal was moderate, almost black in color, and had a lot of clotting. States she is not feeling fatigued, light headed, or dizzy. Patient is taking Norethindrone daily. Patient will call back if she has any changes or starts bleeding again. Advised will notify Dr.Mergenthaler and return call if any additional information.

## 2020-09-02 ENCOUNTER — Encounter: Payer: Self-pay | Admitting: Obstetrics & Gynecology

## 2020-09-05 ENCOUNTER — Telehealth: Payer: Self-pay | Admitting: *Deleted

## 2020-09-05 DIAGNOSIS — Z30430 Encounter for insertion of intrauterine contraceptive device: Secondary | ICD-10-CM

## 2020-09-05 NOTE — Telephone Encounter (Signed)
[name] "Jamie Chung"  [facility] Do you have any availability for next week to get IUD inserted? Im just going to do it at your office.     Reviewed with Dr. Sabra Heck. OK to proceed with US guided  Mirena IUD insertion.   Spoke with patient. Last sexually active 1.5 weeks ago, used condoms. LMP 08/06/20, still spotting.   US guided Mirena IUD scheduled for 09/15/20 at 1pm w/ Dr. Sabra Heck.  Advised patient to continue to abstain from intercourse until IUD is inserted.  Orders placed for precert.   Routing to Dr. Sabra Heck for final review.   Cc: [name] [name]

## 2020-09-05 NOTE — Telephone Encounter (Signed)
See 09/05/20 telephone encounter.   Encounter closed.

## 2020-09-06 ENCOUNTER — Other Ambulatory Visit (HOSPITAL_COMMUNITY): Payer: Self-pay | Admitting: Obstetrics & Gynecology

## 2020-09-06 ENCOUNTER — Other Ambulatory Visit: Payer: Self-pay | Admitting: Obstetrics & Gynecology

## 2020-09-06 DIAGNOSIS — N921 Excessive and frequent menstruation with irregular cycle: Secondary | ICD-10-CM

## 2020-09-06 NOTE — Telephone Encounter (Signed)
Spoke with patient regarding benefits for scheduled ultrasound guided IUD insertion. Patient acknowledges understanding of information presented.  Patient stated that she would like Dr. Ammie Ferrier opinion on doing IUD insertion in the OR. Patient stated that she believes she would be more comfortable, but "whatever Dr. Sabra Heck believes is best."

## 2020-09-06 NOTE — Telephone Encounter (Signed)
Spoke with patient. Advised Dr.Flinchum feels this would be more comfortable for patient in the OR. Patient would like to proceed with IUD insertion in the OR. Ultrasound guided IUD insertion scheduled for 09/19/2020 at 11 am at [facility]. Pre op scheduled for 09/15/2020 at 1:30 pm with Dr.Mcroy. COVID test scheduled for 09/15/2020 at 2:45 pm at [facility] location. Patient is aware of the need to quarantine after test until surgery. 4 week post op scheduled for 10/17/2020 at 4 pm with Dr.Brodbeck. Patient is agreeable to date and time. Surgery instructions reviewed. Patient is aware she will be given surgery packet at her pre op appointment with [place].   Routing to provider and will close encounter.

## 2020-09-07 ENCOUNTER — Other Ambulatory Visit: Payer: Self-pay | Admitting: Obstetrics & Gynecology

## 2020-09-08 ENCOUNTER — Other Ambulatory Visit: Payer: Self-pay

## 2020-09-08 DIAGNOSIS — N921 Excessive and frequent menstruation with irregular cycle: Secondary | ICD-10-CM

## 2020-09-08 MED ORDER — LEVONORGESTREL 20 MCG/24HR IU IUD: INTRAUTERINE_SYSTEM | INTRAUTERINE | Status: AC

## 2020-09-08 NOTE — Progress Notes (Signed)
Mirena IUD order for OR placed.

## 2020-09-12 NOTE — Telephone Encounter (Signed)
FMLA paperwork faxed to number on form. Encounter closed.

## 2020-09-14 ENCOUNTER — Encounter (HOSPITAL_BASED_OUTPATIENT_CLINIC_OR_DEPARTMENT_OTHER): Payer: Self-pay | Admitting: Obstetrics & Gynecology

## 2020-09-14 ENCOUNTER — Other Ambulatory Visit: Payer: Self-pay

## 2020-09-14 ENCOUNTER — Encounter: Payer: Self-pay | Admitting: Obstetrics & Gynecology

## 2020-09-14 NOTE — Progress Notes (Signed)
Needs urine preg day of surgery

## 2020-09-14 NOTE — Progress Notes (Signed)
Spoke w/ via phone for pre-op interview--- Lab needs dos----               Lab results------ COVID test 09/15/20 245  Arrive at 0900 NPO after MN NO Solid Food.  Clear liquids from MN until0800AM Medications to take morning of surgery iNHALERS AS USUAL AND BRING  Diabetic medication ----- Patient Special Instructions PT TO CALL OFFICE OF DR Sabra Heck REGARDIN PHENTERMINE INSTRUCTIONS  Pre-Op special Istructions ----- Patient verbalized understanding of instructions that were given at this phone interview. Patient denies shortness of breath, chest pain, fever, cough at this phone interview.

## 2020-09-15 ENCOUNTER — Ambulatory Visit (INDEPENDENT_AMBULATORY_CARE_PROVIDER_SITE_OTHER): Payer: 59 | Admitting: Obstetrics & Gynecology

## 2020-09-15 ENCOUNTER — Other Ambulatory Visit: Payer: Self-pay | Admitting: Obstetrics & Gynecology

## 2020-09-15 ENCOUNTER — Other Ambulatory Visit (HOSPITAL_COMMUNITY): Payer: Self-pay

## 2020-09-15 ENCOUNTER — Telehealth: Payer: Self-pay | Admitting: *Deleted

## 2020-09-15 ENCOUNTER — Other Ambulatory Visit: Payer: Self-pay

## 2020-09-15 ENCOUNTER — Encounter: Payer: Self-pay | Admitting: Obstetrics & Gynecology

## 2020-09-15 VITALS — BP 114/72 | HR 68 | Resp 16 | Wt 206.0 lb

## 2020-09-15 DIAGNOSIS — D369 Benign neoplasm, unspecified site: Secondary | ICD-10-CM

## 2020-09-15 DIAGNOSIS — Z8742 Personal history of other diseases of the female genital tract: Secondary | ICD-10-CM | POA: Diagnosis not present

## 2020-09-15 DIAGNOSIS — E282 Polycystic ovarian syndrome: Secondary | ICD-10-CM

## 2020-09-15 DIAGNOSIS — N921 Excessive and frequent menstruation with irregular cycle: Secondary | ICD-10-CM

## 2020-09-15 MED ORDER — NORETHINDRONE ACETATE 5 MG PO TABS
5.0000 mg | ORAL_TABLET | Freq: Two times a day (BID) | ORAL | 8 refills | Status: DC
Start: 2020-09-15 — End: 2021-08-29

## 2020-09-15 NOTE — Telephone Encounter (Signed)
See 09/15/20 telephone encounter.   Encounter closed.

## 2020-09-15 NOTE — Telephone Encounter (Signed)
Gunnar Bulla "[name]"  [facility] Hi dr Sabra Heck,  I just wanted to reach out to you and ask if you think i should still get the IUD now that Ive stopped bleeding and spotting for some reason. Ive not had any bleeding in over 2 weeks. Also, no pain except one day.  I was just wondering what your thoughts or suggestions are

## 2020-09-15 NOTE — Progress Notes (Signed)
GYNECOLOGY  VISIT  CC:   Questions about possible surgery  HPI: 39 y.o. G30P0020 Single Black or [place] American female here for discussion of plans for care.  Patient has a history of 5 cm adenomyoma with significant pelvic pain and bleeding.  In December 2019 her oral contraceptives as she was considering pregnancy.  She has also been diagnosed with PCOS.  In the intervening almost 2 years, we had significant issues with heavy bleeding, irregular bleeding and pelvic pain.  She does smoke and cannot be back on combination contraceptive.  She did undergo an MRI for evaluation of the adenomyoma and to rule out myoma.  She has been on Orilissa but this was stopped due to smoking.  In May/2021 she underwent a D&C and IUD placement.  Although she had irregular bleeding after this, her bleeding did improve as well as pain.  Bleeding is better.  Has not had any bleeding in 3 weeks.  Pain is under much better control.  In August she had an episode of increased pain and bleeding.  Ultrasound was done and showed her IUD was low in the uterus.  On physical exam the stem of the IUD was seen protruding from the cervical os.  It was removed at that time.  Patient had light bleeding spotting for about 2 weeks but this is stopped.  She has had no bleeding in 3 weeks.  She is taking norethindrone 5 mg twice daily.  She feels as good now as she has in quite some time.  Reports she just started Bupropion 150XL daily.  She is taking this in addition to sertraline.  She really feels like it is helped.  We discussed smoking cessation today.  She has felt less cravings and urges to smoke because of the bupropion.  She is going to contemplate this.  She is aware she can go back to her combination OCP if she does stop smoking.  For now we are not going to proceed with an IUD insertion in the operating room with ultrasound guidance.  If she is unsuccessful stopping smoking and or pain/bleeding starts to occur again, we can always  decide to move forward.  GYNECOLOGIC HISTORY: Patient's last menstrual period was 08/17/2020. Contraception: none.  Reminded that she needs to be using condoms. Menopausal hormone therapy: n/a  Patient Active Problem List   Diagnosis Date Noted   Menorrhagia with irregular cycle 04/25/2020   Smoker 04/25/2020   Adenomyoma 01/08/2020   History of major depression 03/05/2019   Essential hypertension 03/05/2019   PCOS (polycystic ovarian syndrome) 03/05/2019    Past Medical History:  Diagnosis Date   Allergy    Anemia    Anxiety    Arthritis    Complication of anesthesia    04/2020 [facility] - woke up and was told had asthma attack    Depression    GERD (gastroesophageal reflux disease)    Hypertension    Menorrhagia    adnomyosis   PCOS (polycystic ovarian syndrome)    Pneumonia    hx of 2011    STD (sexually transmitted disease)    trichomonas    Past Surgical History:  Procedure Laterality Date   biopsy of cervix     DILATATION & CURETTAGE/HYSTEROSCOPY WITH MYOSURE N/A 05/02/2020   Procedure: [place];  Surgeon: Megan Salon, MD;  Location: [place];  Service: Gynecology;  Laterality: N/A;   INTRAUTERINE DEVICE (IUD) INSERTION N/A 05/02/2020   Procedure: INTRAUTERINE DEVICE (IUD)  INSERTION/MIRENA IUD;  Surgeon: Megan Salon, MD;  Location: [place];  Service: Gynecology;  Laterality: N/A;  Mirena IUD   LUMBAR PUNCTURE     TONSILLECTOMY     WISDOM TOOTH EXTRACTION      MEDS:   Current Outpatient Medications on File Prior to Visit  Medication Sig Dispense Refill   albuterol (VENTOLIN HFA) 108 (90 Base) MCG/ACT inhaler Inhale 2 puffs into the lungs every 4 (four) hours as needed for wheezing or shortness of breath. 18 g 0   buPROPion (WELLBUTRIN XL) 150 MG 24 hr tablet Take 150 mg by mouth at bedtime.     hydrochlorothiazide (HYDRODIURIL) 25 MG tablet Take 25 mg by mouth daily.      ibuprofen  (ADVIL) 800 MG tablet Take 1 tablet (800 mg total) by mouth every 8 (eight) hours as needed. 30 tablet 1   metFORMIN (GLUCOPHAGE) 500 MG tablet Take 1 tablet (500 mg total) by mouth daily with breakfast. 90 tablet 3   norethindrone (AYGESTIN) 5 MG tablet Take 1 tablet (5 mg total) by mouth in the morning and at bedtime. 60 tablet 1   phentermine (ADIPEX-P) 37.5 MG tablet Take 37.5 mg by mouth daily.      sertraline (ZOLOFT) 100 MG tablet Take 1 tablet (100 mg total) by mouth daily. 30 tablet 2   traMADol (ULTRAM) 50 MG tablet Take 1 tablet (50 mg total) by mouth every 6 (six) hours as needed. Can take 2 tablets every 6 hours for a max dose of 400mg /day, if needed. 30 tablet 0   traZODone (DESYREL) 100 MG tablet Take by mouth.     Current Facility-Administered Medications on File Prior to Visit  Medication Dose Route Frequency Provider Last Rate Last Admin   [START ON 09/19/2020] levonorgestrel (MIRENA) 20 MCG/24HR IUD   Intrauterine To OR Megan Salon, MD        ALLERGIES: Lisinopril and Mobic [meloxicam]  Family History  Problem Relation Age of Onset   Hypertension Mother    Hypertension Maternal Grandmother    Heart attack Maternal Grandfather    Breast cancer Paternal Grandmother     SH:  Single, non smoker  Review of Systems  Constitutional: Negative.   Genitourinary: Negative.     PHYSICAL EXAMINATION:    BP 114/72    Pulse 68    Resp 16    Wt 206 lb (93.4 kg)    LMP 08/17/2020 Comment: irregular period    BMI 31.32 kg/m     General appearance: alert, cooperative and appears stated age   Assessment: History of menorrhagia Adenomyoma Dysmenorrhea Pelvic pain Improved bleeding with Aygestin 5 mg twice daily  Plan: Patient is going to continue with Aygestin 5 mg twice daily.  Rx for #60/refills sent to pharmacy.  She has her annual exam scheduled for next May.  She will plan to keep this for the time being.  She knows to call if she is successful with  stopping smoking.  Also if at any point she decides to proceed with IUD placement she knows to call as well   35 minutes of total time was spent for this patient encounter, including preparation, face-to-face counseling with the patient and coordination of care, and documentation of the encounter.

## 2020-09-15 NOTE — Telephone Encounter (Signed)
Reviewed with Dr. Sabra Heck.  Call returned to patient. Advised patient ultimately her choice. Patient still undecided.  Patient is aware of surgery cancellation fee.  Patient is scheduled for a pre-op visit today at 1:30pm with Dr. Sabra Heck, will keep OV as scheduled to further discuss.   Routing to provider for final review. Patient is agreeable to disposition. Will close encounter.  Cc: [name] [name]

## 2020-09-16 ENCOUNTER — Encounter: Payer: Self-pay | Admitting: Obstetrics & Gynecology

## 2020-09-19 ENCOUNTER — Ambulatory Visit (HOSPITAL_BASED_OUTPATIENT_CLINIC_OR_DEPARTMENT_OTHER): Admission: RE | Admit: 2020-09-19 | Payer: 59 | Source: Home / Self Care | Admitting: Obstetrics & Gynecology

## 2020-09-19 ENCOUNTER — Ambulatory Visit (HOSPITAL_BASED_OUTPATIENT_CLINIC_OR_DEPARTMENT_OTHER)
Admission: RE | Admit: 2020-09-19 | Discharge: 2020-09-19 | Disposition: A | Discharge status: Home / Self Care | Payer: 59 | Source: Ambulatory Visit | Attending: Obstetrics & Gynecology | Admitting: Obstetrics & Gynecology

## 2020-09-19 ENCOUNTER — Other Ambulatory Visit: Payer: Self-pay

## 2020-09-19 DIAGNOSIS — N921 Excessive and frequent menstruation with irregular cycle: Secondary | ICD-10-CM

## 2020-09-19 HISTORY — DX: Pneumonia, unspecified organism: J18.9

## 2020-09-19 HISTORY — DX: Other complications of anesthesia, initial encounter: T88.59XA

## 2020-09-19 SURGERY — INSERTION, INTRAUTERINE DEVICE
Anesthesia: Choice

## 2020-10-07 ENCOUNTER — Ambulatory Visit: Payer: Self-pay | Admitting: Obstetrics & Gynecology

## 2020-10-17 ENCOUNTER — Ambulatory Visit: Payer: 59 | Admitting: Obstetrics & Gynecology

## 2020-10-21 ENCOUNTER — Other Ambulatory Visit: Payer: Self-pay | Admitting: Obstetrics & Gynecology

## 2020-10-24 NOTE — Telephone Encounter (Signed)
Left message to call Murrel Bertram, RN at [facility] [phone].   

## 2020-10-24 NOTE — Telephone Encounter (Signed)
Medication refill request: Tramadol Last OV:  09/15/20 Dr. Sabra Heck Next AEX: 05/05/21 Last MMG (if hormonal medication request): n/a Refill authorized: today, please advise

## 2020-10-24 NOTE — Telephone Encounter (Signed)
Please call the patient and discuss tramadol

## 2020-10-25 NOTE — Telephone Encounter (Signed)
Spoke with patient regarding request for refill of Tramadol.  Patient reports vaginal bleeding daily for the past 3 wks, flow is light to spotting. Reports increased cramping with bleeding, 7 out of 10 on pain scale. Ibuprofen 800 mg q8hrs provides relief. Denies heavy bleeding, N/V, fever/chills, s/s of anemia. She is still taking Aygestin 5 mg bid.   Advised OV needed for further evaluation, patient agreeable to schedule with Dr. Talbert Nan.  Patient request to revisit option for IUD previously discussed with Dr. Sabra Heck.  OV scheduled for 11/01/20 at 2pm. Patient will continue PRN ibuprofen 8090 mg q8h. Will contact office if any new symptoms develop. Advised I will update Dr. Talbert Nan, our office will return call if any additional recommendations. Rx for tramadol denied. Patient verbalizes understanding and is agreeable.   Routing to provider for final review. Patient is agreeable to disposition. Will close encounter.

## 2020-10-25 NOTE — Telephone Encounter (Signed)
Patient returned call

## 2020-11-01 ENCOUNTER — Ambulatory Visit (INDEPENDENT_AMBULATORY_CARE_PROVIDER_SITE_OTHER): Payer: 59 | Admitting: Obstetrics and Gynecology

## 2020-11-01 ENCOUNTER — Encounter: Payer: Self-pay | Admitting: Obstetrics and Gynecology

## 2020-11-01 ENCOUNTER — Other Ambulatory Visit: Payer: Self-pay

## 2020-11-01 ENCOUNTER — Telehealth: Payer: Self-pay

## 2020-11-01 VITALS — BP 160/110 | HR 103 | Ht 68.0 in | Wt 205.0 lb

## 2020-11-01 DIAGNOSIS — Z3009 Encounter for other general counseling and advice on contraception: Secondary | ICD-10-CM

## 2020-11-01 DIAGNOSIS — D369 Benign neoplasm, unspecified site: Secondary | ICD-10-CM | POA: Diagnosis not present

## 2020-11-01 DIAGNOSIS — N921 Excessive and frequent menstruation with irregular cycle: Secondary | ICD-10-CM | POA: Diagnosis not present

## 2020-11-01 MED ORDER — LORAZEPAM 1 MG PO TABS: ORAL_TABLET | ORAL | 0 refills | Status: DC

## 2020-11-01 NOTE — Telephone Encounter (Signed)
Call to patient. Patient advised prescription for Ativan 1mg  sent today to [facility] on [address]. Patient verbalized understanding. Patient states she did sign consent form for IUD insertion while she was in the office today.   Routing to provider and will close encounter.

## 2020-11-01 NOTE — Progress Notes (Signed)
GYNECOLOGY  VISIT   HPI: 39 y.o.   Single Black or African American Not Hispanic or Latino  female   [phone] with No LMP recorded.   here for patient is here for irregular bleeding.  She states that she has been bleeding for about one month at a moderate flow.  Patient is interested in an IUD.   The patient has been followed by Dr Sabra Heck for AUB, pelvic pain, adenomyosis and PCOS. She is a smoker and can't be on OCP's or [place]. She had a hysteroscopy, D&C and mirena  IUD insertion in 5/21. Benign polyps on pathology. The IUD partially expulsed at the 8/21 and was removed. She was started on daily Aygestin, she has been bleeding daily for about a month. Initially it was spotting, now moderate flow. She could wear a pad for 6-8 hours in general (one time in a couple of hours). No pelvic cramps anymore, she still has some lower back pain.   She doesn't currently have a partner. Was using condoms. All of her problems started when she went of of OCP's earlier this year. She is a smoker. This bleeding has been so stressful for her. Earlier this year she bleed daily for 3 months, had pain daily x 5 months. She hasn't had any pain since the D&C. She has been on Aygestin since 12/21, even with the IUD, she was passing huge clots.   GYNECOLOGIC HISTORY: No LMP recorded. Contraception: condoms Menopausal hormone therapy: none         OB History    Gravida  2   Para  0   Term      Preterm      AB  2   Living  0     SAB  1   TAB  1   Ectopic      Multiple      Live Births                 Patient Active Problem List   Diagnosis Date Noted  . Menorrhagia with irregular cycle 04/25/2020  . Smoker 04/25/2020  . Adenomyoma 01/08/2020  . History of major depression 03/05/2019  . Essential hypertension 03/05/2019  . PCOS (polycystic ovarian syndrome) 03/05/2019    Past Medical History:  Diagnosis Date  . Allergy   . Anemia   . Anxiety   . Arthritis   . Complication of  anesthesia    04/2020 [facility] - woke up and was told had asthma attack   . Depression   . GERD (gastroesophageal reflux disease)   . Hypertension   . Menorrhagia    adnomyosis  . PCOS (polycystic ovarian syndrome)   . Pneumonia    hx of 2011   . STD (sexually transmitted disease)    trichomonas    Past Surgical History:  Procedure Laterality Date  . biopsy of cervix    . DILATATION & CURETTAGE/HYSTEROSCOPY WITH MYOSURE N/A 05/02/2020   Procedure: DILATATION & CURETTAGE/HYSTEROSCOPY WITH MYOSURE;  Surgeon: Megan Salon, MD;  Location: [place];  Service: Gynecology;  Laterality: N/A;  . INTRAUTERINE DEVICE (IUD) INSERTION N/A 05/02/2020   Procedure: INTRAUTERINE DEVICE (IUD) INSERTION/MIRENA IUD;  Surgeon: Megan Salon, MD;  Location: [place];  Service: Gynecology;  Laterality: N/A;  Mirena IUD  . LUMBAR PUNCTURE    . TONSILLECTOMY    . WISDOM TOOTH EXTRACTION      Current Outpatient Medications  Medication Sig Dispense Refill  . albuterol (VENTOLIN HFA) 108 (  90 Base) MCG/ACT inhaler Inhale 2 puffs into the lungs every 4 (four) hours as needed for wheezing or shortness of breath. 18 g 0  . buPROPion (WELLBUTRIN XL) 150 MG 24 hr tablet Take 150 mg by mouth at bedtime.    . hydrochlorothiazide (HYDRODIURIL) 25 MG tablet Take 25 mg by mouth daily.     Marland Kitchen ibuprofen (ADVIL) 800 MG tablet Take 1 tablet (800 mg total) by mouth every 8 (eight) hours as needed. 30 tablet 1  . metFORMIN (GLUCOPHAGE) 500 MG tablet Take 1 tablet (500 mg total) by mouth daily with breakfast. 90 tablet 3  . norethindrone (AYGESTIN) 5 MG tablet Take 1 tablet (5 mg total) by mouth in the morning and at bedtime. 60 tablet 8  . sertraline (ZOLOFT) 100 MG tablet Take 1 tablet (100 mg total) by mouth daily. 30 tablet 2  . traMADol (ULTRAM) 50 MG tablet Take 1 tablet (50 mg total) by mouth every 6 (six) hours as needed. Can take 2 tablets every 6 hours for a max dose of 400mg /day, if needed. 30 tablet 0  . traZODone  (DESYREL) 100 MG tablet Take by mouth.    . phentermine (ADIPEX-P) 37.5 MG tablet Take 37.5 mg by mouth daily.      No current facility-administered medications for this visit.     ALLERGIES: Lisinopril and Mobic [meloxicam]  Family History  Problem Relation Age of Onset  . Hypertension Mother   . Hypertension Maternal Grandmother   . Heart attack Maternal Grandfather   . Breast cancer Paternal Grandmother     Social History   Socioeconomic History  . Marital status: Single    Spouse name: Not on file  . Number of children: 0  . Years of education: 73  . Highest education level: Not on file  Occupational History  . Not on file  Tobacco Use  . Smoking status: Current Every Day Smoker    Packs/day: 0.50    Years: 10.00    Pack years: 5.00    Types: Cigarettes  . Smokeless tobacco: Never Used  Vaping Use  . Vaping Use: Never used  Substance and Sexual Activity  . Alcohol use: Yes    Alcohol/week: 3.0 standard drinks    Types: 3 Standard drinks or equivalent per week    Comment: 3 x per week  . Drug use: No  . Sexual activity: Yes    Partners: Male    Birth control/protection: None    Comment: 3 weeks ago  Other Topics Concern  . Not on file  Social History Narrative  . Not on file   Social Determinants of Health   Financial Resource Strain:   . Difficulty of Paying Living Expenses: Not on file  Food Insecurity:   . Worried About Charity fundraiser in the Last Year: Not on file  . Ran Out of Food in the Last Year: Not on file  Transportation Needs:   . Lack of Transportation (Medical): Not on file  . Lack of Transportation (Non-Medical): Not on file  Physical Activity:   . Days of Exercise per Week: Not on file  . Minutes of Exercise per Session: Not on file  Stress:   . Feeling of Stress : Not on file  Social Connections:   . Frequency of Communication with Friends and Family: Not on file  . Frequency of Social Gatherings with Friends and Family: Not  on file  . Attends Religious Services: Not on file  . Active  Member of Clubs or Organizations: Not on file  . Attends Archivist Meetings: Not on file  . Marital Status: Not on file  Intimate Partner Violence:   . Fear of Current or Ex-Partner: Not on file  . Emotionally Abused: Not on file  . Physically Abused: Not on file  . Sexually Abused: Not on file    Review of Systems  All other systems reviewed and are negative.   PHYSICAL EXAMINATION:    BP (!) 160/110   Pulse (!) 103   Ht 5\' 8"  (1.727 m)   Wt 205 lb (93 kg)   SpO2 100%   BMI 31.17 kg/m     General appearance: alert, cooperative and appears stated age   ASSESSMENT AUB, adenomyosis, adenoma on MRI. Can't be on OCP's. Bleeding on daily aygestin Previously expulsed a mirena IUD    PLAN She would like to try another mirena Will return for mirena IUD with ultrasound guidance Will pretreat with ativan and Ibuprofen Reviewed risks of mirean IUD, consent signed.    ~27 minutes in total patient care.

## 2020-11-01 NOTE — Telephone Encounter (Signed)
Spoke with patient regarding benefits for recommended Ultrasound guided IUD insertion. Patient acknowledges understanding of information presented. Patient is aware of cancellation policy. Patient scheduled for 11/08/2020 with Dr. Talbert Nan. Patient stated that she was told that a prescription would be called in prior to the appointment.  Routing to triage regarding prescription.

## 2020-11-08 ENCOUNTER — Encounter: Payer: Self-pay | Admitting: Obstetrics and Gynecology

## 2020-11-08 ENCOUNTER — Other Ambulatory Visit: Payer: Self-pay

## 2020-11-08 ENCOUNTER — Ambulatory Visit (INDEPENDENT_AMBULATORY_CARE_PROVIDER_SITE_OTHER): Payer: 59

## 2020-11-08 ENCOUNTER — Ambulatory Visit (INDEPENDENT_AMBULATORY_CARE_PROVIDER_SITE_OTHER): Payer: 59 | Admitting: Obstetrics and Gynecology

## 2020-11-08 DIAGNOSIS — N921 Excessive and frequent menstruation with irregular cycle: Secondary | ICD-10-CM

## 2020-11-08 DIAGNOSIS — Z3009 Encounter for other general counseling and advice on contraception: Secondary | ICD-10-CM

## 2020-11-08 DIAGNOSIS — Z30430 Encounter for insertion of intrauterine contraceptive device: Secondary | ICD-10-CM

## 2020-11-08 DIAGNOSIS — D369 Benign neoplasm, unspecified site: Secondary | ICD-10-CM

## 2020-11-08 NOTE — Progress Notes (Signed)
GYNECOLOGY  VISIT   HPI: 38 y.o.   Single Black or African American Not Hispanic or Latino  female   [phone] with No LMP recorded.   here for mirena iud insertion under ultrasound guidance. Not sexually active for the last 2 months.     GYNECOLOGIC HISTORY: No LMP recorded. Contraception: condoms Menopausal hormone therapy: NA        OB History    Gravida  2   Para  0   Term      Preterm      AB  2   Living  0     SAB  1   TAB  1   Ectopic      Multiple      Live Births                 Patient Active Problem List   Diagnosis Date Noted  . Menorrhagia with irregular cycle 04/25/2020  . Smoker 04/25/2020  . Adenomyoma 01/08/2020  . History of major depression 03/05/2019  . Essential hypertension 03/05/2019  . PCOS (polycystic ovarian syndrome) 03/05/2019    Past Medical History:  Diagnosis Date  . Allergy   . Anemia   . Anxiety   . Arthritis   . Complication of anesthesia    04/2020 [facility] - woke up and was told had asthma attack   . Depression   . GERD (gastroesophageal reflux disease)   . Hypertension   . Menorrhagia    adnomyosis  . PCOS (polycystic ovarian syndrome)   . Pneumonia    hx of 2011   . STD (sexually transmitted disease)    trichomonas    Past Surgical History:  Procedure Laterality Date  . biopsy of cervix    . DILATATION & CURETTAGE/HYSTEROSCOPY WITH MYOSURE N/A 05/02/2020   Procedure: DILATATION & CURETTAGE/HYSTEROSCOPY WITH MYOSURE;  Surgeon: Megan Salon, MD;  Location: [place];  Service: Gynecology;  Laterality: N/A;  . INTRAUTERINE DEVICE (IUD) INSERTION N/A 05/02/2020   Procedure: INTRAUTERINE DEVICE (IUD) INSERTION/MIRENA IUD;  Surgeon: Megan Salon, MD;  Location: [place];  Service: Gynecology;  Laterality: N/A;  Mirena IUD  . LUMBAR PUNCTURE    . TONSILLECTOMY    . WISDOM TOOTH EXTRACTION      Current Outpatient Medications  Medication Sig Dispense Refill  . albuterol (VENTOLIN HFA) 108 (90 Base) MCG/ACT  inhaler Inhale 2 puffs into the lungs every 4 (four) hours as needed for wheezing or shortness of breath. 18 g 0  . buPROPion (WELLBUTRIN XL) 150 MG 24 hr tablet Take 150 mg by mouth at bedtime.    . hydrochlorothiazide (HYDRODIURIL) 25 MG tablet Take 25 mg by mouth daily.     Marland Kitchen ibuprofen (ADVIL) 800 MG tablet Take 1 tablet (800 mg total) by mouth every 8 (eight) hours as needed. 30 tablet 1  . LORazepam (ATIVAN) 1 MG tablet Take one tablet one hour prior to procedure (do not drive) 1 tablet 0  . metFORMIN (GLUCOPHAGE) 500 MG tablet Take 1 tablet (500 mg total) by mouth daily with breakfast. 90 tablet 3  . norethindrone (AYGESTIN) 5 MG tablet Take 1 tablet (5 mg total) by mouth in the morning and at bedtime. 60 tablet 8  . phentermine (ADIPEX-P) 37.5 MG tablet Take 37.5 mg by mouth daily.     . sertraline (ZOLOFT) 100 MG tablet Take 1 tablet (100 mg total) by mouth daily. 30 tablet 2  . traMADol (ULTRAM) 50 MG tablet Take 1  tablet (50 mg total) by mouth every 6 (six) hours as needed. Can take 2 tablets every 6 hours for a max dose of 400mg /day, if needed. 30 tablet 0  . traZODone (DESYREL) 100 MG tablet Take by mouth.     No current facility-administered medications for this visit.     ALLERGIES: Lisinopril and Mobic [meloxicam]  Family History  Problem Relation Age of Onset  . Hypertension Mother   . Hypertension Maternal Grandmother   . Heart attack Maternal Grandfather   . Breast cancer Paternal Grandmother     Social History   Socioeconomic History  . Marital status: Single    Spouse name: Not on file  . Number of children: 0  . Years of education: 15  . Highest education level: Not on file  Occupational History  . Not on file  Tobacco Use  . Smoking status: Current Every Day Smoker    Packs/day: 0.50    Years: 10.00    Pack years: 5.00    Types: Cigarettes  . Smokeless tobacco: Never Used  Vaping Use  . Vaping Use: Never used  Substance and Sexual Activity  . Alcohol  use: Yes    Alcohol/week: 3.0 standard drinks    Types: 3 Standard drinks or equivalent per week    Comment: 3 x per week  . Drug use: No  . Sexual activity: Yes    Partners: Male    Birth control/protection: None    Comment: 3 weeks ago  Other Topics Concern  . Not on file  Social History Narrative  . Not on file   Social Determinants of Health   Financial Resource Strain:   . Difficulty of Paying Living Expenses: Not on file  Food Insecurity:   . Worried About Charity fundraiser in the Last Year: Not on file  . Ran Out of Food in the Last Year: Not on file  Transportation Needs:   . Lack of Transportation (Medical): Not on file  . Lack of Transportation (Non-Medical): Not on file  Physical Activity:   . Days of Exercise per Week: Not on file  . Minutes of Exercise per Session: Not on file  Stress:   . Feeling of Stress : Not on file  Social Connections:   . Frequency of Communication with Friends and Family: Not on file  . Frequency of Social Gatherings with Friends and Family: Not on file  . Attends Religious Services: Not on file  . Active Member of Clubs or Organizations: Not on file  . Attends Archivist Meetings: Not on file  . Marital Status: Not on file  Intimate Partner Violence:   . Fear of Current or Ex-Partner: Not on file  . Emotionally Abused: Not on file  . Physically Abused: Not on file  . Sexually Abused: Not on file    ROS  PHYSICAL EXAMINATION:    There were no vitals taken for this visit.    General appearance: alert, cooperative and appears stated age   Pelvic: External genitalia:  no lesions              Urethra:  normal appearing urethra with no masses, tenderness or lesions              Bartholins and Skenes: normal                 Vagina: normal appearing vagina with normal color and discharge, no lesions  Cervix: no lesions  The risks of the mirena IUD were reviewed with the patient, including infection,  abnormal bleeding and uterine perfortion. Consent was signed.  A speculum was placed in the vagina, the cervix was cleansed with betadine. A tenaculum was placed on the cervix, the uterus sounded to ~8 cm. The cervix was dilated to a 5 hagar dilator  The mirena IUD was inserted with ultrasound guidance. The string were cut to 3-4 cm. The tenaculum was removed. Slight oozing from the tenaculum site was stopped with pressure. IUD in place on Abdominal and TV ultrasound.  The patient tolerated the procedure well.    Chaperone was present for exam.  ASSESSMENT Mirena iud inserted with u/s guidance    PLAN F/u in one month

## 2020-11-08 NOTE — Patient Instructions (Signed)
IUD Post-procedure Instructions . Cramping is common.  You may take Ibuprofen, Aleve, or Tylenol for the cramping.  This should resolve within 24 hours.   . You may have a small amount of spotting.  You should wear a mini pad for the next few days. . You may have intercourse in 24 hours. . You need to call the office if you have any pelvic pain, fever, heavy bleeding, or foul smelling vaginal discharge. . Shower or bathe as normal . Use back up contraception for one week . Use condoms for STD protection

## 2020-12-12 ENCOUNTER — Telehealth: Payer: Self-pay

## 2020-12-12 ENCOUNTER — Encounter: Payer: Self-pay | Admitting: Obstetrics and Gynecology

## 2020-12-12 NOTE — Telephone Encounter (Signed)
Spoke with patient. US guided Mirena IUD placed 11/08/20. Patient reports spotting until 12/16. Menses started again on 12/04/20. Reports menses is light, changing a regular, non-saturated pad q4-5 hours. Patient reports intermittent, sharp, right and left sided pelvic pain 10/10 that started 1.5 wks ago. Pain is worse when sitting. Patient states pain is similar to when pain initially started and what brought her to see Dr. Hyacinth Meeker. Has been taling 800 mg ibuprofen with no relief. Heating pad helps. Denies N/V, fever/chills.   Patient has not scheduled 1 mo IUD f/u. Recommended OV with Dr. Oscar La for further evaluation. Patient declined OV offered for today at 4:30pm, OV scheduled for 12/28 at 11am. ER/Urgent care precautions reviewed. Reviewed alternating Tylenol and Ibuprofen as need for pain. Patient states she has taken 800 mg ibuprofen and tylenol together in the past, is unsure of current recommendations. Advised I will review with Dr. Oscar La and return call, patient agreeable.   Dr. Oscar La -please review and advise on ibuprofen and tylenol.

## 2020-12-12 NOTE — Telephone Encounter (Signed)
Spoke with patient, advised per Dr. Jertson. Patient verbalizes understanding and is agreeable.   Encounter closed.  

## 2020-12-12 NOTE — Telephone Encounter (Signed)
Jamie Ganja [name]  [facility] Hi Dr. Oscar La,  I'm having those sharp pains again that had before. I cannot remember what Dr Hyacinth Meeker had me take if 800mg  wasnt working. I believe it was 500mg  tylenol and 800mg  Ibuprofen..can you help me out on that. Is that something you're familiar with or recommend?. These pains are extremely bad.

## 2020-12-12 NOTE — Telephone Encounter (Signed)
She can take the ibuprofen and tylenol together.

## 2020-12-13 ENCOUNTER — Ambulatory Visit (INDEPENDENT_AMBULATORY_CARE_PROVIDER_SITE_OTHER): Payer: 59 | Admitting: Obstetrics and Gynecology

## 2020-12-13 ENCOUNTER — Other Ambulatory Visit: Payer: Self-pay

## 2020-12-13 ENCOUNTER — Encounter: Payer: Self-pay | Admitting: Obstetrics and Gynecology

## 2020-12-13 VITALS — BP 150/88 | HR 101 | Ht 67.5 in | Wt 201.8 lb

## 2020-12-13 DIAGNOSIS — T8332XA Displacement of intrauterine contraceptive device, initial encounter: Secondary | ICD-10-CM

## 2020-12-13 DIAGNOSIS — N800 Endometriosis of uterus: Secondary | ICD-10-CM

## 2020-12-13 DIAGNOSIS — R102 Pelvic and perineal pain: Secondary | ICD-10-CM | POA: Diagnosis not present

## 2020-12-13 DIAGNOSIS — N8003 Adenomyosis of the uterus: Secondary | ICD-10-CM

## 2020-12-13 DIAGNOSIS — N921 Excessive and frequent menstruation with irregular cycle: Secondary | ICD-10-CM | POA: Diagnosis not present

## 2020-12-13 NOTE — Patient Instructions (Signed)
Leuprolide injection What is this medicine? LEUPROLIDE (loo PROE lide) is a man-made hormone. It is used to treat the symptoms of prostate cancer. This medicine may also be used to treat children with early onset of puberty. It may be used for other hormonal conditions. This medicine may be used for other purposes; ask your health care provider or pharmacist if you have questions. COMMON BRAND NAME(S): Lupron What should I tell my health care provider before I take this medicine? They need to know if you have any of these conditions:  diabetes  heart disease or previous heart attack  high blood pressure  high cholesterol  pain or difficulty passing urine  spinal cord metastasis  stroke  tobacco smoker  an unusual or allergic reaction to leuprolide, benzyl alcohol, other medicines, foods, dyes, or preservatives  pregnant or trying to get pregnant  breast-feeding How should I use this medicine? This medicine is for injection under the skin or into a muscle. You will be taught how to prepare and give this medicine. Use exactly as directed. Take your medicine at regular intervals. Do not take your medicine more often than directed. It is important that you put your used needles and syringes in a special sharps container. Do not put them in a trash can. If you do not have a sharps container, call your pharmacist or healthcare provider to get one. A special MedGuide will be given to you by the pharmacist with each prescription and refill. Be sure to read this information carefully each time. Talk to your pediatrician regarding the use of this medicine in children. While this medicine may be prescribed for children as young as 8 years for selected conditions, precautions do apply. Overdosage: If you think you have taken too much of this medicine contact a poison control center or emergency room at once. NOTE: This medicine is only for you. Do not share this medicine with others. What if  I miss a dose? If you miss a dose, take it as soon as you can. If it is almost time for your next dose, take only that dose. Do not take double or extra doses. What may interact with this medicine? Do not take this medicine with any of the following medications:  chasteberry This medicine may also interact with the following medications:  herbal or dietary supplements, like black cohosh or DHEA  female hormones, like estrogens or progestins and birth control pills, patches, rings, or injections  female hormones, like testosterone This list may not describe all possible interactions. Give your health care provider a list of all the medicines, herbs, non-prescription drugs, or dietary supplements you use. Also tell them if you smoke, drink alcohol, or use illegal drugs. Some items may interact with your medicine. What should I watch for while using this medicine? Visit your doctor or health care professional for regular checks on your progress. During the first week, your symptoms may get worse, but then will improve as you continue your treatment. You may get hot flashes, increased bone pain, increased difficulty passing urine, or an aggravation of nerve symptoms. Discuss these effects with your doctor or health care professional, some of them may improve with continued use of this medicine. Female patients may experience a menstrual cycle or spotting during the first 2 months of therapy with this medicine. If this continues, contact your doctor or health care professional. This medicine may increase blood sugar. Ask your healthcare provider if changes in diet or medicines are needed if   you have diabetes. What side effects may I notice from receiving this medicine? Side effects that you should report to your doctor or health care professional as soon as possible:  allergic reactions like skin rash, itching or hives, swelling of the face, lips, or tongue  breathing problems  chest  pain  depression or memory disorders  pain in your legs or groin  pain at site where injected  severe headache  signs and symptoms of high blood sugar such as being more thirsty or hungry or having to urinate more than normal. You may also feel very tired or have blurry vision  swelling of the feet and legs  visual changes  vomiting Side effects that usually do not require medical attention (report to your doctor or health care professional if they continue or are bothersome):  breast swelling or tenderness  decrease in sex drive or performance  diarrhea  hot flashes  loss of appetite  muscle, joint, or bone pains  nausea  redness or irritation at site where injected  skin problems or acne This list may not describe all possible side effects. Call your doctor for medical advice about side effects. You may report side effects to FDA at 1-800-FDA-1088. Where should I keep my medicine? Keep out of the reach of children. Store below 25 degrees C (77 degrees F). Do not freeze. Protect from light. Do not use if it is not clear or if there are particles present. Throw away any unused medicine after the expiration date. NOTE: This sheet is a summary. It may not cover all possible information. If you have questions about this medicine, talk to your doctor, pharmacist, or health care provider.  2020 Elsevier/Gold Standard (2018-10-02 09:52:48)  

## 2020-12-13 NOTE — Progress Notes (Signed)
GYNECOLOGY  VISIT   HPI: 39 y.o.   Single Black or African American Not Hispanic or Latino  female   G2P0020 with Patient's last menstrual period was 11/25/2020.   here for Iud follow up and pelvic pain. Patient states that she has been bleeding for the past 10 days. She says that she is having pelvic pain. She is having sharp pain that seem more frequent.     The patient has a h/o AUB, pelvic pain, adenomyosis and PCOS. She is a smoker and can't be on [place] or OCP's. She had a hysteroscopy, D&C with mirena IUD insertion in 5/21 (benign polyps). The IUD partially expulsed and was removed in 8/21. She was then started on daily aygestin. She had a mirena inserted under ultrasound guidance last month.  She c/o pelvic pain since a week after IUD insertion. The pain is intermittent throughout the day. The pain is sharp, changes location in her lower abdomen/pelvis. The pain ranges from an 8-10 in severity. Hurts to sit up and drive. She had this pain last time she had an iud inserted, lasted for 2 months then became less frequent.  She is bleeding like a light period. She has had 5 days without bleeding since the IUD was inserted. She is changing her 2-3 x a day. She has been sexually active x 1 since the IUD was inserted. Same partner, used condom.  She has chronic lower back pain.   Previously on orlissa, she didn't feel like it helped.   She she is still on the daily aygestin, has hemorrhaged off of it even with the IUD in the past.   GYNECOLOGIC HISTORY: Patient's last menstrual period was 11/25/2020. Contraception:IUD Menopausal hormone therapy: none         OB History    Gravida  2   Para  0   Term      Preterm      AB  2   Living  0     SAB  1   IAB  1   Ectopic      Multiple      Live Births                 Patient Active Problem List   Diagnosis Date Noted  . Menorrhagia with irregular cycle 04/25/2020  . Smoker 04/25/2020  . Adenomyoma 01/08/2020  .  History of major depression 03/05/2019  . Essential hypertension 03/05/2019  . PCOS (polycystic ovarian syndrome) 03/05/2019    Past Medical History:  Diagnosis Date  . Allergy   . Anemia   . Anxiety   . Arthritis   . Complication of anesthesia    04/2020 [facility] - woke up and was told had asthma attack   . Depression   . GERD (gastroesophageal reflux disease)   . Hypertension   . Menorrhagia    adnomyosis  . PCOS (polycystic ovarian syndrome)   . Pneumonia    hx of 2011   . STD (sexually transmitted disease)    trichomonas    Past Surgical History:  Procedure Laterality Date  . biopsy of cervix    . DILATATION & CURETTAGE/HYSTEROSCOPY WITH MYOSURE N/A 05/02/2020   Procedure: DILATATION & CURETTAGE/HYSTEROSCOPY WITH MYOSURE;  Surgeon: Megan Salon, MD;  Location: [place];  Service: Gynecology;  Laterality: N/A;  . INTRAUTERINE DEVICE (IUD) INSERTION N/A 05/02/2020   Procedure: INTRAUTERINE DEVICE (IUD) INSERTION/MIRENA IUD;  Surgeon: Megan Salon, MD;  Location: [place];  Service: Gynecology;  Laterality: N/A;  Mirena IUD  . LUMBAR PUNCTURE    . TONSILLECTOMY    . WISDOM TOOTH EXTRACTION      Current Outpatient Medications  Medication Sig Dispense Refill  . albuterol (VENTOLIN HFA) 108 (90 Base) MCG/ACT inhaler Inhale 2 puffs into the lungs every 4 (four) hours as needed for wheezing or shortness of breath. 18 g 0  . buPROPion (WELLBUTRIN XL) 150 MG 24 hr tablet Take 150 mg by mouth at bedtime.    . hydrochlorothiazide (HYDRODIURIL) 25 MG tablet Take 25 mg by mouth daily.     Marland Kitchen ibuprofen (ADVIL) 800 MG tablet Take 1 tablet (800 mg total) by mouth every 8 (eight) hours as needed. 30 tablet 1  . metFORMIN (GLUCOPHAGE) 500 MG tablet Take 1 tablet (500 mg total) by mouth daily with breakfast. 90 tablet 3  . norethindrone (AYGESTIN) 5 MG tablet Take 1 tablet (5 mg total) by mouth in the morning and at bedtime. 60 tablet 8  . sertraline (ZOLOFT) 100 MG tablet Take 1 tablet  (100 mg total) by mouth daily. 30 tablet 2  . traZODone (DESYREL) 100 MG tablet Take by mouth.    . phentermine (ADIPEX-P) 37.5 MG tablet Take 37.5 mg by mouth daily.      No current facility-administered medications for this visit.     ALLERGIES: Lisinopril and Mobic [meloxicam]  Family History  Problem Relation Age of Onset  . Hypertension Mother   . Hypertension Maternal Grandmother   . Heart attack Maternal Grandfather   . Breast cancer Paternal Grandmother     Social History   Socioeconomic History  . Marital status: Single    Spouse name: Not on file  . Number of children: 0  . Years of education: 67  . Highest education level: Not on file  Occupational History  . Not on file  Tobacco Use  . Smoking status: Current Every Day Smoker    Packs/day: 0.50    Years: 10.00    Pack years: 5.00    Types: Cigarettes  . Smokeless tobacco: Never Used  Vaping Use  . Vaping Use: Never used  Substance and Sexual Activity  . Alcohol use: Yes    Alcohol/week: 3.0 standard drinks    Types: 3 Standard drinks or equivalent per week    Comment: 3 x per week  . Drug use: No  . Sexual activity: Yes    Partners: Male    Birth control/protection: None    Comment: 3 weeks ago  Other Topics Concern  . Not on file  Social History Narrative  . Not on file   Social Determinants of Health   Financial Resource Strain: Not on file  Food Insecurity: Not on file  Transportation Needs: Not on file  Physical Activity: Not on file  Stress: Not on file  Social Connections: Not on file  Intimate Partner Violence: Not on file    ROS see above.   PHYSICAL EXAMINATION:    BP (!) 150/88   Pulse (!) 101   Ht 5' 7.5" (1.715 m)   Wt 201 lb 12.8 oz (91.5 kg)   LMP 11/25/2020   SpO2 99%   BMI 31.14 kg/m     General appearance: alert, cooperative and appears stated age Abdomen: soft, non-tender; non distended, no masses,  no organomegaly  Pelvic: External genitalia:  no lesions               Urethra:  normal appearing urethra with no masses, tenderness  or lesions              Bartholins and Skenes: normal                 Vagina: normal appearing vagina with normal color and discharge, no lesions              Cervix: no cervical motion tenderness and no lesions              Bimanual Exam:  Uterus:  normal size, contour, position, consistency, mobility, non-tender and anteverted              Adnexa: no mass, fullness, tenderness              Pelvic floor: not tender  Chaperone was present for exam.  ASSESSMENT AUB Pelvic pain H/O adenomyosis IUD strings missing    PLAN Return for ultrasound Can't take OCP's Orlissa didn't help  Mirena hasn't helped Daily aygestin, still bleeding Discussed the option of laparoscopy for her pain Discussed the option of lupron for up to one year to give her time to consider conception. Information from the lupron website given.  Will refer to Dr April Manson to discuss pregnancy Discussed the option of hysterectomy.    An After Visit Summary was printed and given to the patient.  Over 30 minutes in total patient care

## 2020-12-15 ENCOUNTER — Encounter: Payer: Self-pay | Admitting: Obstetrics and Gynecology

## 2020-12-22 ENCOUNTER — Encounter: Payer: Self-pay | Admitting: Obstetrics and Gynecology

## 2021-01-25 ENCOUNTER — Telehealth: Payer: 59 | Admitting: Family

## 2021-01-25 DIAGNOSIS — R079 Chest pain, unspecified: Secondary | ICD-10-CM

## 2021-01-25 NOTE — Progress Notes (Signed)
Based on what you shared with me, I feel your condition warrants further evaluation and I recommend that you be seen for a face to face office visit.   I am sorry, I can not order a chest x-ray through an Evisit. You could try doing a video visit through you MyChart or going to Urgent care. I am sorry.    NOTE: If you entered your credit card information for this eVisit, you will not be charged. You may see a "hold" on your card for the $35 but that hold will drop off and you will not have a charge processed.   If you are having a true medical emergency please call 911.      For an urgent face to face visit, [facility] has five urgent care centers for your convenience:     [facility] [place] Get Driving Directions [phone] [place], [place] [address] . 10 am - 6pm Monday - Friday    [facility]) Get Driving Directions [phone] [address] [place], [place] [address] . 10 am to 8 pm Monday-Friday . 12 pm to 8 pm [facility] Get Driving Directions [phone] [place], [place], [place] [address] . 8 am to 8 pm Monday-Friday . 9 am to 6 pm Saturday . 11 am to 6 pm Sunday     [facility] at [facility] Get Driving Directions  [phone] [address] [place], [place] [address] . 8 am to 8 pm Monday-Friday . 8 am to 4 pm [facility] [place] Get Driving Directions [phone] [address] [place], [place] [address] . 12 pm to 6 pm Monday-Friday      Your e-visit answers were reviewed by a board certified advanced clinical practitioner to complete your personal care plan.  Thank you for using e-Visits.

## 2021-04-07 ENCOUNTER — Encounter: Payer: Self-pay | Admitting: Obstetrics and Gynecology

## 2021-04-13 ENCOUNTER — Telehealth: Payer: Self-pay

## 2021-04-13 NOTE — Telephone Encounter (Signed)
Salvadore Dom, MD  You 59 minutes ago (1:59 PM)   Alesia Morin,  I don't see notes about her FLMA and don't remember doing this. In general I don't do long term FLMA. I think if she is having so much pain from the IUD, we need to reconsider alternatives.  Can you help me find the FLMA paperwork that we previously did?  Thanks,  [employer] text    You  Salvadore Dom, MD 3 days ago   Hey Dr. Talbert Nan,   Are we going to extend this patient's FMLA? If so, does she need to come back in to be seen?   Thank you,  Laurie Penado   Message text    Thamas Jaegers, RMA routed conversation to You 6 days ago   Sabra Heck, [place] [name]  Salvadore Dom, MD 6 days ago      Hi, I have attached my extension paperwork that has to be filled out and submitted by May 5th. Please let me know if you have any further questions.  I periodically still have to be out of work for the same reasons. Just not as much as I used to. With this illness you just never know when you're going to have an episode. I went from having one every day to maybe 3-4 times a month since I've gotten the IUD.

## 2021-04-13 NOTE — Telephone Encounter (Signed)
After speaking with Dr. Talbert Nan, she is not comfortable with extending this patient's FMLA. Original FMLA paperwork was done by Dr. Sabra Heck. In general, our office does not do long term FMLA. Dr. Talbert Nan suggests that the patient reconsider alternatives for her episodes, if she is still having to be out of work that often.  Routing to [employer], Therapist, sports, to follow up with patient regarding alternative treatments.

## 2021-04-17 NOTE — Telephone Encounter (Signed)
[name], [name] routed conversation to You 33 minutes ago (3:26 PM)   [name] routed conversation to [name], [name] 5 hours ago (10:33 AM)   Jamie Chung, Jamie Chung "Jamie Chung"  [name] 7 hours ago (8:25 AM)      Doristine Devoid! Thanks so much!  Also, do you know when my FMLA extension papers will be faxed because they messaging me about them being due May 5th and just don't want to get in trouble for not getting them done     Jamie Chung, Jamie Chung "Jamie Chung" 7 hours ago (8:16 AM)   CS   Dr Talbert Nan does not have anything for May 5 and she does not work May 6. I scheduled your appointment for her first available May 9 at 10:00 if this does not work for you let we would be happy to reschedule.      [name]"  Patient Appointment Schedule Request Pool Yesterday (10:21 AM)      Appointment Request From: Jamie Chung. Jamie Chung  With Provider: Salvadore Dom, MD [facility] [place]  Preferred Date Range: 04/20/2021 - 04/21/2021  Preferred Times: Any Time  Reason for visit: Office Visit  Comments: In pain every day and bleeding. my Mirena needs to be checked again.

## 2021-04-17 NOTE — Telephone Encounter (Signed)
Spoke with patient. Advised per Dr. Talbert Nan regarding IUD and FMLA.  Patient expressed concerns about her job and extending [place].  Advised last OV with Dr. Talbert Nan 12/13/20, OV needed for further evaluation and discussion. Currently scheduled for AEX 5/9, offered to move visit to 5/3. Patient declined, request visit on 5/5. AEX r/s to 5/5 at 0930. Advised patient she will need visit to further discuss, if provider agreeable to extend FMLA this does not allow time to have paperwork to be submitted by 04/20/21. Advised per review of last OV PUS was also recommended for further evaluation of missing IUD strings. Patient states she was not aware f/u needed. Advised to proceed with AEX as scheduled, will update Dr. Talbert Nan and f/u if any additional recommendations. Patient verbalizes understanding.   Routing to provider for final review. Patient is agreeable to disposition. Will close encounter.  Cc: [name] [name]

## 2021-04-17 NOTE — Telephone Encounter (Signed)
Left message to call Elanna Bert, RN at [facility], [phone].  

## 2021-04-20 ENCOUNTER — Ambulatory Visit: Payer: Self-pay | Admitting: Obstetrics and Gynecology

## 2021-04-20 DIAGNOSIS — Z0289 Encounter for other administrative examinations: Secondary | ICD-10-CM

## 2021-04-24 ENCOUNTER — Encounter (HOSPITAL_BASED_OUTPATIENT_CLINIC_OR_DEPARTMENT_OTHER): Payer: Self-pay

## 2021-04-24 ENCOUNTER — Ambulatory Visit: Payer: 59 | Admitting: Obstetrics and Gynecology

## 2021-04-24 ENCOUNTER — Ambulatory Visit (HOSPITAL_BASED_OUTPATIENT_CLINIC_OR_DEPARTMENT_OTHER): Payer: 59 | Admitting: Obstetrics & Gynecology

## 2021-05-05 ENCOUNTER — Ambulatory Visit: Payer: 59

## 2021-05-09 ENCOUNTER — Encounter (HOSPITAL_BASED_OUTPATIENT_CLINIC_OR_DEPARTMENT_OTHER): Payer: Self-pay | Admitting: Obstetrics & Gynecology

## 2021-05-09 ENCOUNTER — Other Ambulatory Visit: Payer: Self-pay

## 2021-05-09 ENCOUNTER — Ambulatory Visit (INDEPENDENT_AMBULATORY_CARE_PROVIDER_SITE_OTHER): Payer: 59 | Admitting: Obstetrics & Gynecology

## 2021-05-09 VITALS — BP 162/97 | HR 82 | Ht 67.75 in | Wt 221.0 lb

## 2021-05-09 DIAGNOSIS — E282 Polycystic ovarian syndrome: Secondary | ICD-10-CM

## 2021-05-09 DIAGNOSIS — D369 Benign neoplasm, unspecified site: Secondary | ICD-10-CM

## 2021-05-09 DIAGNOSIS — R937 Abnormal findings on diagnostic imaging of other parts of musculoskeletal system: Secondary | ICD-10-CM

## 2021-05-09 DIAGNOSIS — Z01419 Encounter for gynecological examination (general) (routine) without abnormal findings: Secondary | ICD-10-CM | POA: Diagnosis not present

## 2021-05-09 DIAGNOSIS — Z87891 Personal history of nicotine dependence: Secondary | ICD-10-CM

## 2021-05-09 DIAGNOSIS — R200 Anesthesia of skin: Secondary | ICD-10-CM

## 2021-05-09 DIAGNOSIS — D251 Intramural leiomyoma of uterus: Secondary | ICD-10-CM

## 2021-05-09 DIAGNOSIS — N921 Excessive and frequent menstruation with irregular cycle: Secondary | ICD-10-CM

## 2021-05-09 DIAGNOSIS — N8003 Adenomyosis of the uterus: Secondary | ICD-10-CM

## 2021-05-09 DIAGNOSIS — N800 Endometriosis of uterus: Secondary | ICD-10-CM | POA: Diagnosis not present

## 2021-05-09 DIAGNOSIS — Z975 Presence of (intrauterine) contraceptive device: Secondary | ICD-10-CM

## 2021-05-09 NOTE — Progress Notes (Signed)
40 y.o. G76P0020 Single Black or [place] American female here for annual exam and follow up.  She had a Mirena replaced in December.  This was done with ultrasound guidance.  At follow up, string not seen.  Ultrasound recommended.  Reports she was never called about scheduling.  Overall, bleeding is better.  She is having some persistent spotting.  Pain is still present on her LLQ.  Continues to desire pregnancy but feels she must proceed with hysterectomy.  Inseminations now with pregnancy discussed.  Also if proceeded with hysterectomy, egg retrieval with freezing and surrogate discussed.  Pt feels last partner was her last chance.  Feel she and I are at same discussion we had multiple times last year.    Saw Zara Chess in early May.  Thyroid function was low and has been referred to endocrinologist.  Also, EKG was abnormal and has cardiology appt as well.  Quit smoking three weeks ago.  On wellbutrin and this has helepd.  PCP adjusted dosage.  For bleeding, did use combination OCP but this was stopped due to smoking.  Could consider restarting OCP if no smoking for >6 months.  Pt has used progesterone only pills, Orilissa and aygestin without significant improvement in bleeding.  Separately, having numbness at tips of thumb and index and middle finger.  Has been present for about a month.  Discussed with PCP.  Neck x ray showed some narrowing and "spurring".  Done with [facility] and in care everywhere.  Does use a computer at work.    Asks for continuance of FMLA paperwork for overall body pain that she attributes to adenomyosis.  Says she's read this on a support group.  Advised this is typically not an adenomyosis symptom.  Feel should proceed with referral that has been placed and also with cardiology evaluation.  May need rheumatology evaluation as well.        Sexually active: No.  The current method of family planning is none and abstinence.    Smoker:  no  Health Maintenance: Pap:  01/06/2019  negative History of abnormal Pap:  no MMG:  Screening guidelines reviewed Colonoscopy:  screening guidelines reviewed TDaP:  Declined today Hep C testing: 01/2019 Screening Labs: done with PCP in May   reports that she quit smoking about 3 weeks ago. Her smoking use included cigarettes. She has a 5.00 pack-year smoking history. She has never used smokeless tobacco. She reports current alcohol use of about 3.0 standard drinks of alcohol per week. She reports that she does not use drugs.  Past Medical History:  Diagnosis Date  . Allergy   . Anemia   . Anxiety   . Arthritis   . Complication of anesthesia    04/2020 [facility] - woke up and was told had asthma attack   . Depression   . GERD (gastroesophageal reflux disease)   . Hypertension   . Menorrhagia    adnomyosis  . PCOS (polycystic ovarian syndrome)   . Pneumonia    hx of 2011   . STD (sexually transmitted disease)    trichomonas    Past Surgical History:  Procedure Laterality Date  . biopsy of cervix    . DILATATION & CURETTAGE/HYSTEROSCOPY WITH MYOSURE N/A 05/02/2020   Procedure: DILATATION & CURETTAGE/HYSTEROSCOPY WITH MYOSURE;  Surgeon: Megan Salon, MD;  Location: [place];  Service: Gynecology;  Laterality: N/A;  . INTRAUTERINE DEVICE (IUD) INSERTION N/A 05/02/2020   Procedure: INTRAUTERINE DEVICE (IUD) INSERTION/MIRENA IUD;  Surgeon: Megan Salon,  MD;  Location: [place];  Service: Gynecology;  Laterality: N/A;  Mirena IUD  . LUMBAR PUNCTURE    . TONSILLECTOMY    . WISDOM TOOTH EXTRACTION      Current Outpatient Medications  Medication Sig Dispense Refill  . albuterol (VENTOLIN HFA) 108 (90 Base) MCG/ACT inhaler Inhale 2 puffs into the lungs every 4 (four) hours as needed for wheezing or shortness of breath. 18 g 0  . buPROPion (WELLBUTRIN XL) 150 MG 24 hr tablet Take 150 mg by mouth at bedtime.    . hydrochlorothiazide (HYDRODIURIL) 25 MG tablet Take 25 mg by mouth daily.     Marland Kitchen ibuprofen (ADVIL) 800 MG tablet  Take 1 tablet (800 mg total) by mouth every 8 (eight) hours as needed. 30 tablet 1  . metFORMIN (GLUCOPHAGE) 500 MG tablet Take 1 tablet (500 mg total) by mouth daily with breakfast. 90 tablet 3  . norethindrone (AYGESTIN) 5 MG tablet Take 1 tablet (5 mg total) by mouth in the morning and at bedtime. 60 tablet 8  . sertraline (ZOLOFT) 100 MG tablet Take 1 tablet (100 mg total) by mouth daily. 30 tablet 2  . traZODone (DESYREL) 100 MG tablet Take by mouth.     No current facility-administered medications for this visit.    Family History  Problem Relation Age of Onset  . Hypertension Mother   . Hypertension Maternal Grandmother   . Heart attack Maternal Grandfather   . Breast cancer Paternal Grandmother     Review of Systems  Constitutional: Negative.   Respiratory: Negative.   Cardiovascular: Negative.   Genitourinary: Positive for pelvic pain and vaginal bleeding.    Exam:   BP (!) 162/97   Pulse 82   Ht 5' 7.75" (1.721 m)   Wt 221 lb (100.2 kg)   LMP  (LMP Unknown)   BMI 33.85 kg/m   Height: 5' 7.75" (172.1 cm)  General appearance: alert, cooperative and appears stated age Head: Normocephalic, without obvious abnormality, atraumatic Neck: no adenopathy, supple, symmetrical, trachea midline and thyroid normal to inspection and palpation Lungs: clear to auscultation bilaterally Breasts: normal appearance, no masses or tenderness Heart: regular rate and rhythm Abdomen: soft, non-tender; bowel sounds normal; no masses,  no organomegaly Extremities: extremities normal, atraumatic, no cyanosis or edema Skin: Skin color, texture, turgor normal. No rashes or lesions Lymph nodes: Cervical, supraclavicular, and axillary nodes normal. No abnormal inguinal nodes palpated Neurologic: Grossly normal   Pelvic: External genitalia:  no lesions              Urethra:  normal appearing urethra with no masses, tenderness or lesions              Bartholins and Skenes: normal                  Vagina: normal appearing vagina with normal color and no discharge, no lesions              Cervix: no lesions, no IUD strings noted              Pap taken: No. Bimanual Exam:  Uterus:  Bulky, about 10 weeks size              Adnexa: normal adnexa               Rectovaginal: Confirms               Anus:  normal sphincter tone, no lesions  Bedside ultrasound does confirm  intrauterine placement of IUD.  Chaperone, Octaviano Batty, CMA, was present for exam.  Assessment/Plan: 1. Well woman exam with routine gynecological exam - pap 01/06/2019 negative with negative HR HPV.  Not indicated today. - breast and colon cancer screening guidelines reviewed - vaccines updated.  Tdap due.  Declines today. - lab work done in May - declines need for STD testing  2. Finger numbness and abnormal x-ray of cervical spine - AMB referral to sports medicine  3. Adenomyosis - has Mirena in placed - consider restarting OCP if continues to not smoke  4. PCOS (polycystic ovarian syndrome) - on metformin daily.  5. Menorrhagia with irregular cycle - highly encouraged pt to consider options for pregnancy for deciding to proceed with surgery as a hysterectomy is not reversible.  States she will discuss with her mother.  6. IUD (intrauterine device) in place  7. Fibroids, intramural  8. History of smoking  9.  RLQ pain - will try gabapentin 300mg  nightly for pain to see if helps.  Aware should not take with any other sedative as this can make her sleepy.

## 2021-05-10 ENCOUNTER — Encounter (HOSPITAL_BASED_OUTPATIENT_CLINIC_OR_DEPARTMENT_OTHER): Payer: Self-pay

## 2021-05-10 DIAGNOSIS — Z87891 Personal history of nicotine dependence: Secondary | ICD-10-CM | POA: Insufficient documentation

## 2021-05-10 MED ORDER — GABAPENTIN 300 MG PO CAPS: 300.0000 mg | ORAL_CAPSULE | Freq: Every day | ORAL | 1 refills | Status: DC

## 2021-05-12 ENCOUNTER — Ambulatory Visit (HOSPITAL_BASED_OUTPATIENT_CLINIC_OR_DEPARTMENT_OTHER): Payer: 59 | Admitting: Obstetrics & Gynecology

## 2021-05-16 ENCOUNTER — Encounter (HOSPITAL_BASED_OUTPATIENT_CLINIC_OR_DEPARTMENT_OTHER): Payer: Self-pay | Admitting: *Deleted

## 2021-05-22 ENCOUNTER — Encounter (HOSPITAL_BASED_OUTPATIENT_CLINIC_OR_DEPARTMENT_OTHER): Payer: Self-pay

## 2021-05-24 ENCOUNTER — Telehealth (HOSPITAL_BASED_OUTPATIENT_CLINIC_OR_DEPARTMENT_OTHER): Payer: Self-pay | Admitting: Obstetrics & Gynecology

## 2021-05-24 ENCOUNTER — Other Ambulatory Visit (HOSPITAL_BASED_OUTPATIENT_CLINIC_OR_DEPARTMENT_OTHER): Payer: Self-pay | Admitting: Obstetrics & Gynecology

## 2021-05-24 NOTE — Telephone Encounter (Signed)
Called patient and left message to call the office so we can set up her appointment before surgery with Dr.Sweet.

## 2021-06-06 ENCOUNTER — Telehealth: Payer: Self-pay | Admitting: *Deleted

## 2021-06-06 IMAGING — MR MR PELVIS WO/W CM
9 of 14 series · 19 of 48 positions shown · IV contrast (gadavist)
Comparison: Ultrasound 12/17/2019, 12/14/2019, 11/15/2008

CLINICAL DATA: Evaluate uterine mass.  Abnormal uterine bleeding.

EXAM:
MRI PELVIS WITHOUT AND WITH CONTRAST
TECHNIQUE: Multiplanar multisequence MR imaging of the pelvis was performed
both before and after administration of intravenous contrast.
CONTRAST:  10mL GADAVIST GADOBUTROL 1 MMOL/ML IV SOLN

[Series 3: T2 · coronal · 6.0mm · 0.86mm/px · 2 of 29 slices shown (1 of 4)]
[im 1/29]
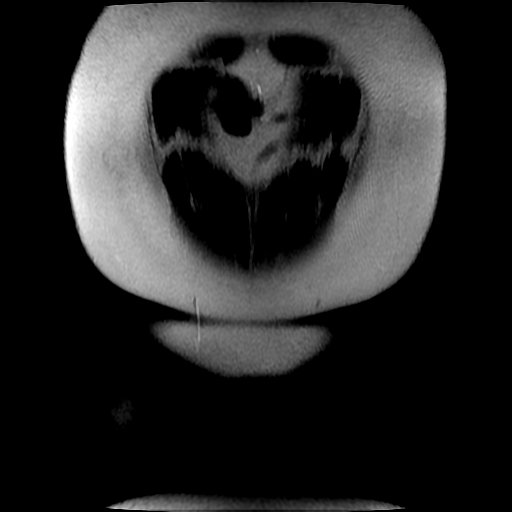
[im 29/29]
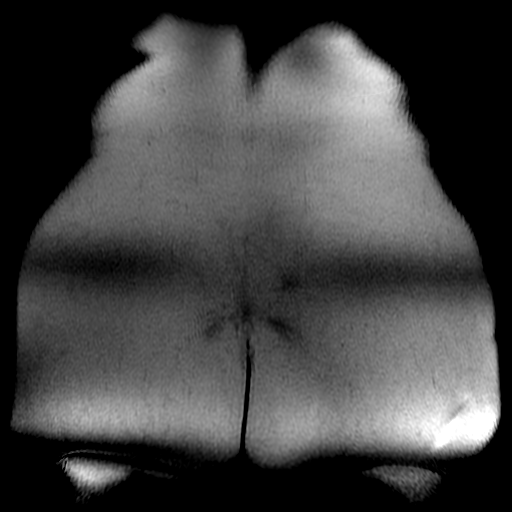

[Series 4: T2 · coronal · 5.0mm · 0.47mm/px · 2 of 32 slices shown (2 of 4)]
[im 1/32]
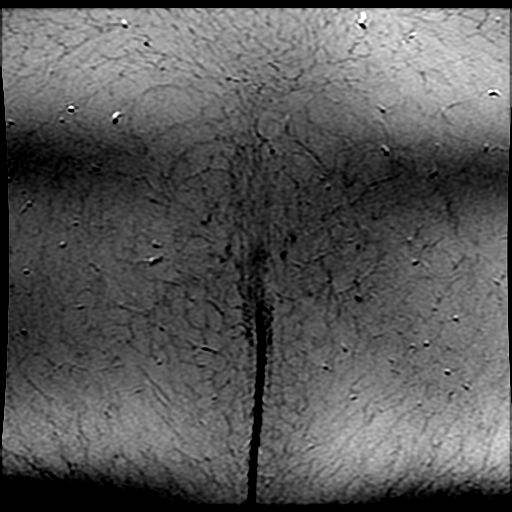
[im 32/32]
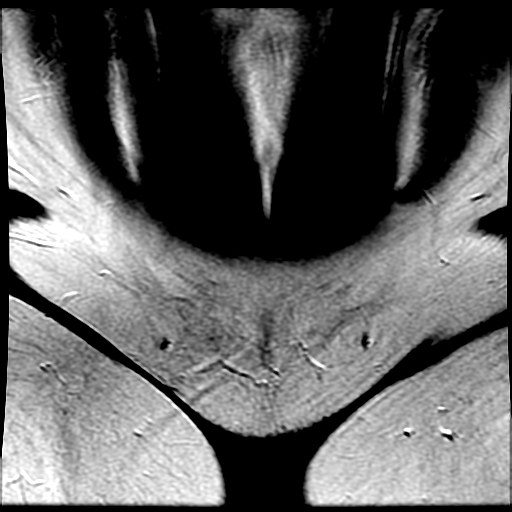

[Series 5: T2 · axial · 5.0mm · 0.49mm/px · z∈[-140,+88]mm · 2 of 39 slices shown (3 of 4)]
[im 1/39]
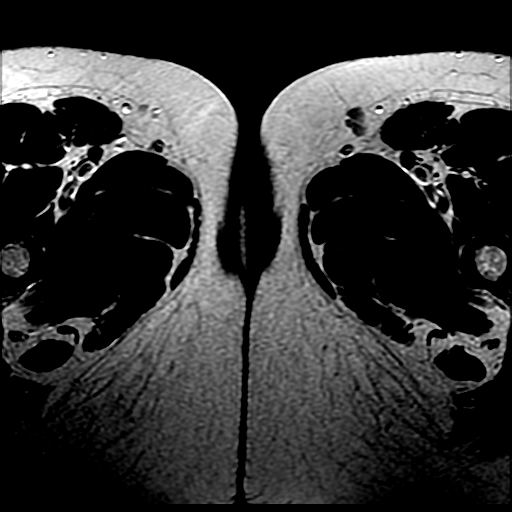
[im 39/39]
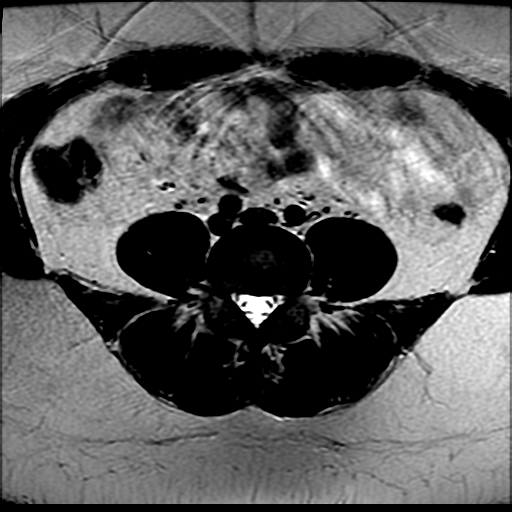

[Series 6: T2 fat-sat · axial · 5.0mm · 0.49mm/px · z∈[-140,+88]mm · 2 of 39 slices shown]
[im 1/39]
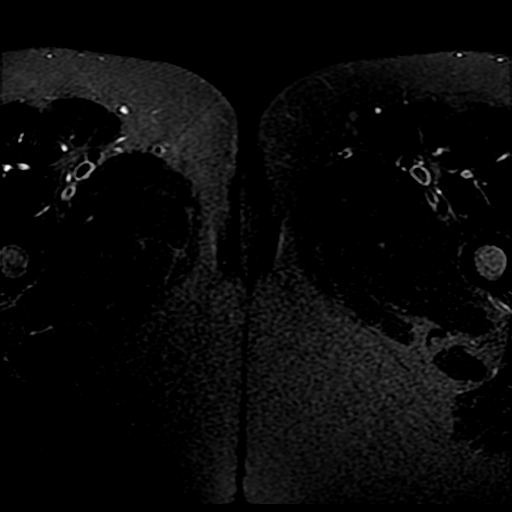
[im 39/39]
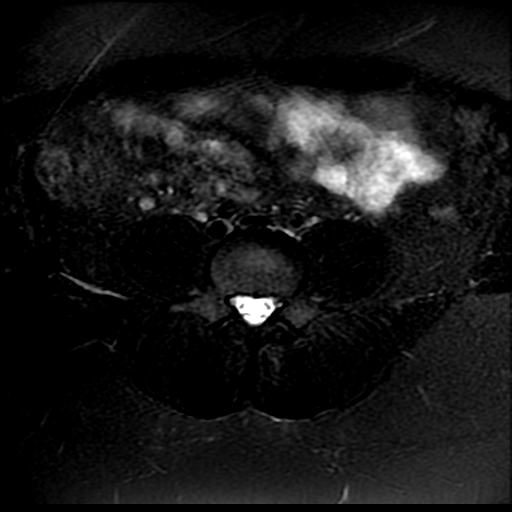

[Series 7: T2 · sagittal · 5.0mm · 0.47mm/px · 2 of 26 slices shown (4 of 4)]
[im 1/26]
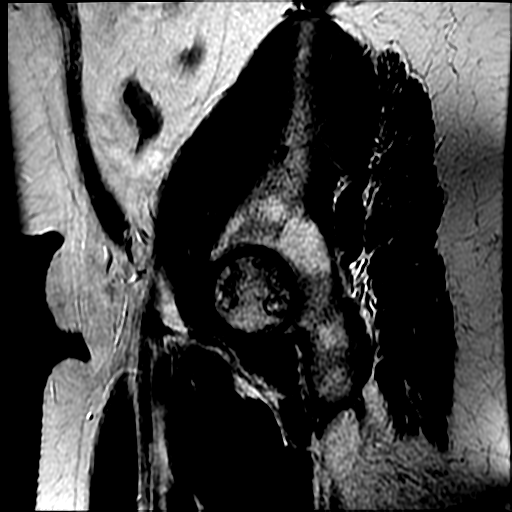
[im 26/26]
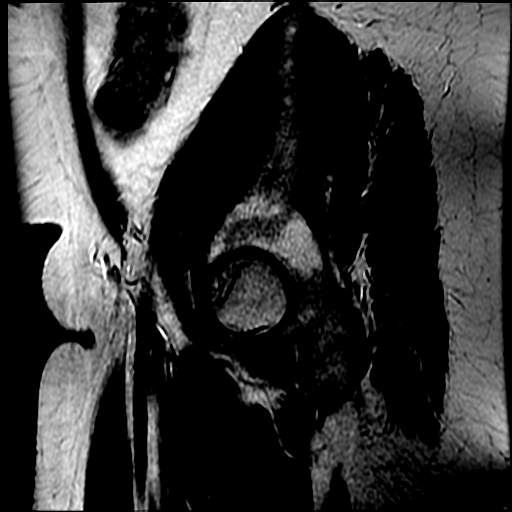

[Series 8: T1 · axial · 5.0mm · 0.49mm/px · z∈[-140,+88]mm · 2 of 39 slices shown]
[im 1/39]
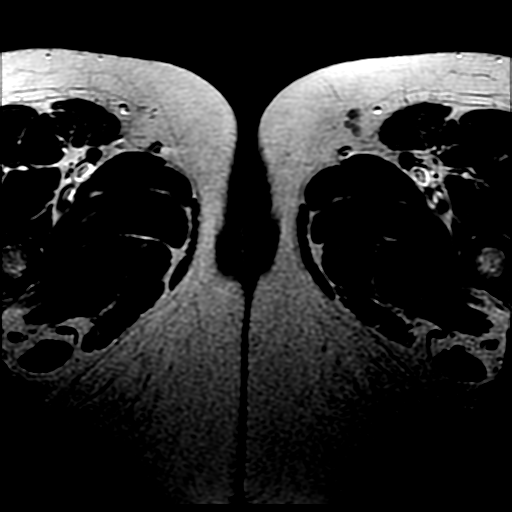
[im 39/39]
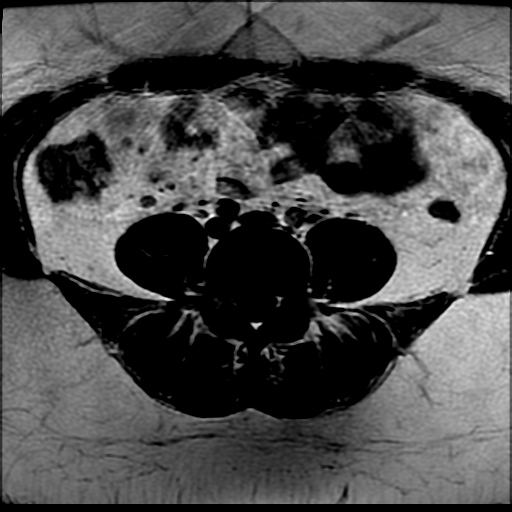

[Series 12: T2 post-contrast · sagittal · 5.0mm · 0.47mm/px · 2 of 26 slices shown]
[im 1/26]
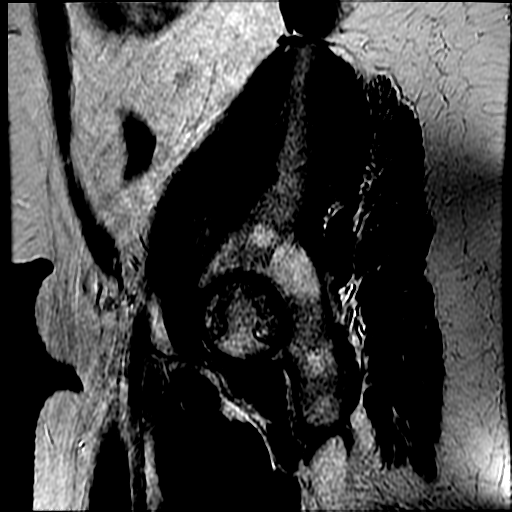
[im 26/26]
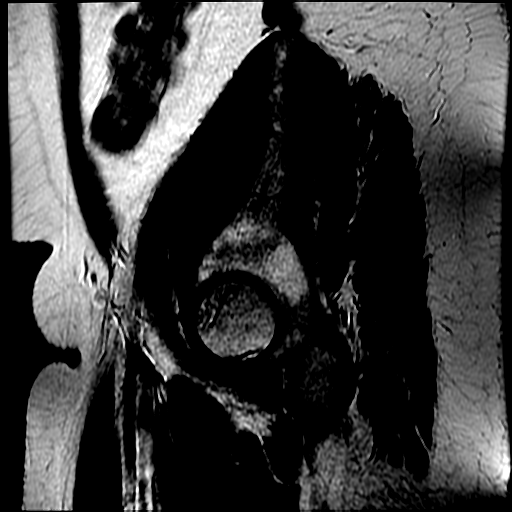

[Series 13: T1 fat-sat post-contrast · axial · 5.0mm · 0.47mm/px · z∈[-181,+59]mm · 3 of 41 slices shown (1 of 2)]
[im 1/41]
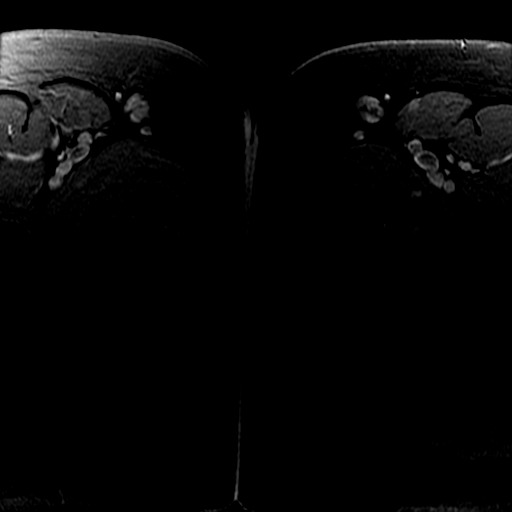
[im 21/41]
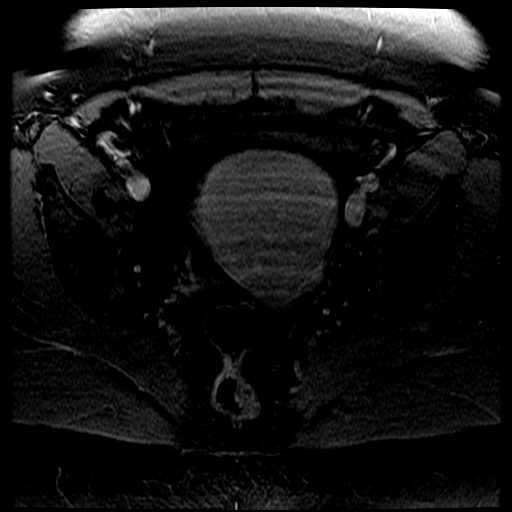
[im 41/41]
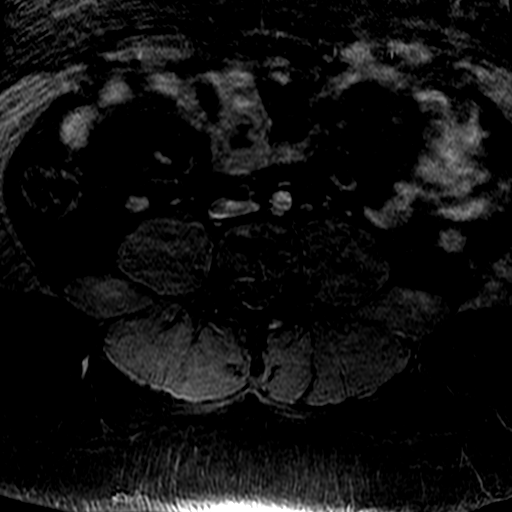

[Series 14: T1 fat-sat post-contrast · coronal · 5.0mm · 0.47mm/px · 2 of 32 slices shown (2 of 2)]
[im 1/32]
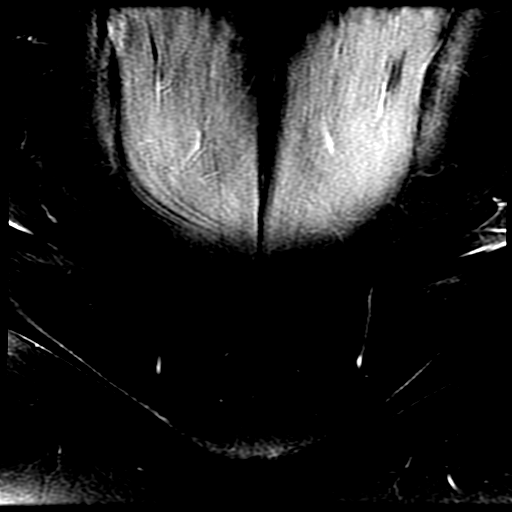
[im 32/32]
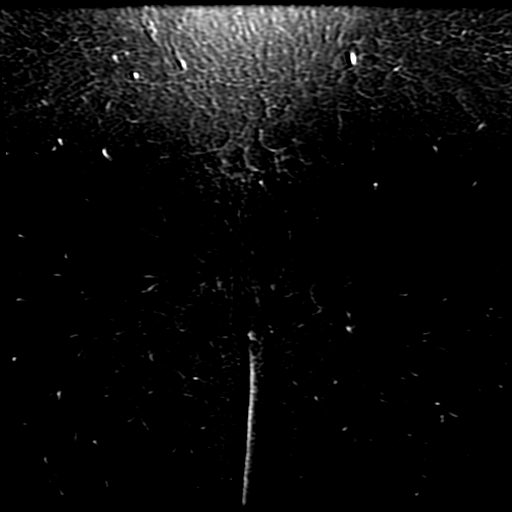

[19 of 48 positions shown; findings below may reference images not displayed]

FINDINGS: Urinary Tract: Distal ureters and bladder normal.

Bowel: Limited view of the colon and rectum unremarkable

Vascular/Lymphatic:  No pelvic lymphadenopathy.

Reproductive:

Uterus: Measures uterus measures 9.9 by 6.5 by 6.7 cm. Along the
posterior wall of the uterus there is masslike and enlargement
measuring 5.4 by 3.8 by 5.2 cm which extends from the endometrium to
the serosal surface. This posterior uterine wall masslike
enlargement has multiple high signal intensity crypts scattered
within the masslike thickening (image [DATE], T2 weighted imaging).
The junctional zone is poorly marginated in the posterior wall.
These findings are suggestive of masslike focal adenomyosis.

Endometrium is poorly defined and measures approximately 6 mm in
double wall thickness.

There is a cleft along the anterior margin of the endometrial canal
(image [DATE]).

Right ovary: Normal ovary with small peripheral follicles measuring
2.5 x 1.9 cm (image [DATE].

Left ovary: Normal LEFT ovary with small peripheral follicles
measuring 2.6 by 2.0 cm on image [DATE].

Other: No free fluid

Musculoskeletal:  Unremarkable
IMPRESSION: 1. Masslike thickening in the posterior wall of the uterine
myometrium is most consistent with focal adenomyosis.
2. Normal endometrium.
3. Normal ovaries.

## 2021-06-06 NOTE — Telephone Encounter (Signed)
Call to patient. Advised of surgery time change to 1230 and arrive at 1030.  Encounter closed.

## 2021-06-07 ENCOUNTER — Ambulatory Visit: Payer: 59 | Admitting: Family Medicine

## 2021-06-08 ENCOUNTER — Encounter (HOSPITAL_BASED_OUTPATIENT_CLINIC_OR_DEPARTMENT_OTHER): Payer: Self-pay

## 2021-06-08 NOTE — Telephone Encounter (Signed)
Called and spoke with patient. I informed patient that we did receive her message. Patient was told that Dr. Sabra Heck is out of the office this week and will return on Monday.

## 2021-06-20 ENCOUNTER — Encounter (HOSPITAL_BASED_OUTPATIENT_CLINIC_OR_DEPARTMENT_OTHER): Payer: Self-pay | Admitting: Obstetrics & Gynecology

## 2021-06-20 ENCOUNTER — Ambulatory Visit (INDEPENDENT_AMBULATORY_CARE_PROVIDER_SITE_OTHER): Payer: 59 | Admitting: Obstetrics & Gynecology

## 2021-06-20 ENCOUNTER — Other Ambulatory Visit: Payer: Self-pay

## 2021-06-20 VITALS — BP 135/90 | Wt 225.0 lb

## 2021-06-20 DIAGNOSIS — D369 Benign neoplasm, unspecified site: Secondary | ICD-10-CM

## 2021-06-20 DIAGNOSIS — N921 Excessive and frequent menstruation with irregular cycle: Secondary | ICD-10-CM

## 2021-06-20 DIAGNOSIS — N8003 Adenomyosis of the uterus: Secondary | ICD-10-CM

## 2021-06-20 DIAGNOSIS — Z87891 Personal history of nicotine dependence: Secondary | ICD-10-CM

## 2021-06-20 DIAGNOSIS — Z975 Presence of (intrauterine) contraceptive device: Secondary | ICD-10-CM

## 2021-06-20 DIAGNOSIS — D251 Intramural leiomyoma of uterus: Secondary | ICD-10-CM

## 2021-06-20 DIAGNOSIS — N800 Endometriosis of uterus: Secondary | ICD-10-CM

## 2021-06-20 NOTE — Progress Notes (Signed)
40 y.o. G62P0020 Single Black or [place] American female here for discussion of upcoming procedure.  TLH with bilateral salpingectomy planned due to menorrhagia with irregular bleeding, cramping and pelvic pain.  Pt is still smoking some so cannot be estrogen containing contraceptives.  She has failed several progesterone options.  She experienced an expelled IUD.  Currently, does have a mirena IUD that is controlling bleeding better then any other method but she is ready for definitive surgery .  Pre-op evaluation thus far has included ultrasound 11/08/2020 with uterus measuring 8.2 x 6.2 x 5.7cm with a small posterior fibroid and adenomyosis.  Polyp resection with D&C was done 05/02/2020 showing endometrial polyps and benign endometrium.       Pt clearly understands that hysterectomy will remove ability to carry own child in her own uterus.  States she is ready to proceed with definitive management.  Procedure discussed with patient.  Hospital stay, recovery and pain management all discussed.  Risks discussed including but not limited to bleeding, 1% risk of receiving a  transfusion, infection, 3-4% risk of bowel/bladder/ureteral/vascular injury discussed as well as possible need for additional surgery if injury does occur discussed.  DVT/PE and rare risk of death discussed.  My actual complications with prior surgeries discussed.  Vaginal cuff dehiscence discussed.  Hernia formation discussed.  Positioning and incision locations discussed.  Patient aware if pathology abnormal she may need additional treatment.  All questions answered.     Ob Hx:   No LMP recorded. (Menstrual status: Irregular Periods).          Sexually active: yes Birth control: IUD Last pap: 01/06/2019 Last MMG: not started Smoking: Yes   Past Surgical History:  Procedure Laterality Date   biopsy of cervix     DILATATION & CURETTAGE/HYSTEROSCOPY WITH MYOSURE N/A 05/02/2020   Procedure: Esmeralda;  Surgeon: Megan Salon, MD;  Location: [place];  Service: Gynecology;  Laterality: N/A;   INTRAUTERINE DEVICE (IUD) INSERTION N/A 05/02/2020   Procedure: INTRAUTERINE DEVICE (IUD) INSERTION/MIRENA IUD;  Surgeon: Megan Salon, MD;  Location: [place];  Service: Gynecology;  Laterality: N/A;  Mirena IUD   LUMBAR PUNCTURE     TONSILLECTOMY     WISDOM TOOTH EXTRACTION      Past Medical History:  Diagnosis Date   Allergy    Anemia    Anxiety    Arthritis    Complication of anesthesia    04/2020 [facility] - woke up and was told had asthma attack    Depression    GERD (gastroesophageal reflux disease)    Hypertension    Menorrhagia    adnomyosis   PCOS (polycystic ovarian syndrome)    Pneumonia    hx of 2011    STD (sexually transmitted disease)    trichomonas    Allergies: Lisinopril and Mobic [meloxicam]  Current Outpatient Medications  Medication Sig Dispense Refill   albuterol (VENTOLIN HFA) 108 (90 Base) MCG/ACT inhaler Inhale 2 puffs into the lungs every 4 (four) hours as needed for wheezing or shortness of breath. 18 g 0   buPROPion (WELLBUTRIN XL) 150 MG 24 hr tablet Take 150 mg by mouth at bedtime.     gabapentin (NEURONTIN) 300 MG capsule Take 1 capsule (300 mg total) by mouth at bedtime. 30 capsule 1   hydrochlorothiazide (HYDRODIURIL) 25 MG tablet Take 25 mg by mouth daily.      ibuprofen (ADVIL) 800 MG tablet Take 1 tablet (800 mg total) by  mouth every 8 (eight) hours as needed. 30 tablet 1   metFORMIN (GLUCOPHAGE) 500 MG tablet Take 1 tablet (500 mg total) by mouth daily with breakfast. 90 tablet 3   norethindrone (AYGESTIN) 5 MG tablet Take 1 tablet (5 mg total) by mouth in the morning and at bedtime. 60 tablet 8   sertraline (ZOLOFT) 100 MG tablet Take 1 tablet (100 mg total) by mouth daily. 30 tablet 2   traZODone (DESYREL) 100 MG tablet Take by mouth.     No current facility-administered medications for this visit.    ROS: Pertinent items are noted in  HPI.  Exam:   BP 135/90   Wt 225 lb (102.1 kg)   BMI 34.46 kg/m   General appearance: alert and cooperative Head: Normocephalic, without obvious abnormality, atraumatic Neck: no adenopathy, supple, symmetrical, trachea midline and thyroid not enlarged, symmetric, no tenderness/mass/nodules Lungs: clear to auscultation bilaterally Heart: regular rate and rhythm, S1, S2 normal, no murmur, click, rub or gallop Abdomen: soft, non-tender; bowel sounds normal; no masses,  no organomegaly Extremities: extremities normal, atraumatic, no cyanosis or edema Skin: Skin color, texture, turgor normal. No rashes or lesions Lymph nodes: Cervical, supraclavicular, and axillary nodes normal. no inguinal nodes palpated Neurologic: Grossly normal  Pelvic: not performed  Assessment/Plan: 1. Adenomyosis - TLH/bilateral salpingectomy/cystoscopy planned.  Pre and post op instructions reviewed.  Post op pain medications reviewed.    2. Menorrhagia with irregular cycle  3. Adenomyoma  4. IUD (intrauterine device) in place  5. Fibroids, intramural  6. History of smoking

## 2021-06-21 ENCOUNTER — Encounter (HOSPITAL_BASED_OUTPATIENT_CLINIC_OR_DEPARTMENT_OTHER): Payer: Self-pay | Admitting: *Deleted

## 2021-06-26 DIAGNOSIS — Z975 Presence of (intrauterine) contraceptive device: Secondary | ICD-10-CM | POA: Insufficient documentation

## 2021-06-26 DIAGNOSIS — D251 Intramural leiomyoma of uterus: Secondary | ICD-10-CM | POA: Insufficient documentation

## 2021-06-27 ENCOUNTER — Encounter (HOSPITAL_BASED_OUTPATIENT_CLINIC_OR_DEPARTMENT_OTHER): Payer: Self-pay

## 2021-06-30 ENCOUNTER — Encounter (HOSPITAL_COMMUNITY)
Admission: RE | Admit: 2021-06-30 | Discharge: 2021-06-30 | Disposition: A | Discharge status: Home / Self Care | Payer: 59 | Source: Ambulatory Visit | Attending: Anesthesiology | Admitting: Anesthesiology

## 2021-06-30 ENCOUNTER — Other Ambulatory Visit (HOSPITAL_COMMUNITY): Payer: 59

## 2021-07-04 ENCOUNTER — Encounter (HOSPITAL_BASED_OUTPATIENT_CLINIC_OR_DEPARTMENT_OTHER): Admission: RE | Payer: Self-pay | Source: Home / Self Care

## 2021-07-04 ENCOUNTER — Ambulatory Visit (HOSPITAL_BASED_OUTPATIENT_CLINIC_OR_DEPARTMENT_OTHER): Admission: RE | Admit: 2021-07-04 | Payer: 59 | Source: Home / Self Care | Admitting: Obstetrics & Gynecology

## 2021-07-04 SURGERY — HYSTERECTOMY, TOTAL, LAPAROSCOPIC, WITH SALPINGECTOMY
Anesthesia: Choice | Laterality: Bilateral

## 2021-07-07 ENCOUNTER — Other Ambulatory Visit (HOSPITAL_BASED_OUTPATIENT_CLINIC_OR_DEPARTMENT_OTHER): Payer: Self-pay | Admitting: Obstetrics & Gynecology

## 2021-07-07 DIAGNOSIS — Z319 Encounter for procreative management, unspecified: Secondary | ICD-10-CM

## 2021-07-11 ENCOUNTER — Telehealth (HOSPITAL_BASED_OUTPATIENT_CLINIC_OR_DEPARTMENT_OTHER): Payer: Self-pay

## 2021-07-11 ENCOUNTER — Encounter (HOSPITAL_BASED_OUTPATIENT_CLINIC_OR_DEPARTMENT_OTHER): Payer: Self-pay

## 2021-07-11 NOTE — Telephone Encounter (Signed)
-----   Message from Megan Salon, MD sent at 07/11/2021  6:15 AM EDT ----- Regarding: referral I placed a referral to Dr. Kerin Perna for this pt.  Can you confirm that it was sent and can you follow up on the appt for her?  Thanks.

## 2021-07-11 NOTE — Telephone Encounter (Signed)
I called Dr. Charlett Chung office to check on referral. Jamie Chung at the office says patient has scheduled and canceled twice. Once 04/2020 and a second time 04/2021. Their office policy is to schedule/reschedule a patient 3 times and that is it. After talking to Dr. Charlett Chung office I called the patient. She no longer want to go to that office. She is now scheduled with Dr. Judee Chung on 08/30/2021 at [facility]. She wants you to know that she chose that location because she has several friends with different situations that have gone to that office and had great success. Patient also wants to know if you want her to wait until after her consult to come in to have IUD removed? Please advise.

## 2021-07-11 NOTE — Telephone Encounter (Signed)
Sent patient message via MyChart to wait on appointment for IUD removal until after she see Dr. At [facility] tbw

## 2021-08-13 ENCOUNTER — Encounter (HOSPITAL_BASED_OUTPATIENT_CLINIC_OR_DEPARTMENT_OTHER): Payer: Self-pay

## 2021-08-14 ENCOUNTER — Encounter (HOSPITAL_BASED_OUTPATIENT_CLINIC_OR_DEPARTMENT_OTHER): Payer: Self-pay

## 2021-08-23 ENCOUNTER — Encounter (HOSPITAL_BASED_OUTPATIENT_CLINIC_OR_DEPARTMENT_OTHER): Payer: Self-pay

## 2021-08-29 ENCOUNTER — Encounter (HOSPITAL_BASED_OUTPATIENT_CLINIC_OR_DEPARTMENT_OTHER): Payer: Self-pay | Admitting: Obstetrics & Gynecology

## 2021-08-29 ENCOUNTER — Other Ambulatory Visit: Payer: Self-pay

## 2021-08-29 ENCOUNTER — Ambulatory Visit (INDEPENDENT_AMBULATORY_CARE_PROVIDER_SITE_OTHER): Payer: 59 | Admitting: Obstetrics & Gynecology

## 2021-08-29 VITALS — BP 144/86 | HR 88 | Ht 68.0 in | Wt 234.4 lb

## 2021-08-29 DIAGNOSIS — N979 Female infertility, unspecified: Secondary | ICD-10-CM

## 2021-08-29 DIAGNOSIS — N926 Irregular menstruation, unspecified: Secondary | ICD-10-CM | POA: Diagnosis not present

## 2021-08-29 DIAGNOSIS — T8332XA Displacement of intrauterine contraceptive device, initial encounter: Secondary | ICD-10-CM | POA: Diagnosis not present

## 2021-08-29 DIAGNOSIS — I10 Essential (primary) hypertension: Secondary | ICD-10-CM

## 2021-08-29 DIAGNOSIS — T8332XD Displacement of intrauterine contraceptive device, subsequent encounter: Secondary | ICD-10-CM

## 2021-08-29 MED ORDER — LORAZEPAM 1 MG PO TABS: ORAL_TABLET | ORAL | 0 refills | Status: AC

## 2021-08-29 MED ORDER — NORETHINDRONE ACETATE 5 MG PO TABS: 5.0000 mg | ORAL_TABLET | Freq: Two times a day (BID) | ORAL | 4 refills | Status: DC

## 2021-08-29 MED ORDER — LABETALOL HCL 100 MG PO TABS: 100.0000 mg | ORAL_TABLET | Freq: Two times a day (BID) | ORAL | 2 refills | Status: DC

## 2021-08-31 ENCOUNTER — Other Ambulatory Visit (HOSPITAL_BASED_OUTPATIENT_CLINIC_OR_DEPARTMENT_OTHER): Payer: Self-pay | Admitting: Obstetrics & Gynecology

## 2021-08-31 DIAGNOSIS — T8332XD Displacement of intrauterine contraceptive device, subsequent encounter: Secondary | ICD-10-CM

## 2021-08-31 NOTE — Progress Notes (Signed)
40 y.o. Jamie Chung Single African American female presents for removal of Mirena IUD.  Pt is pursuing pregnancy at this time and is being followed at [facility].  Had virtual visit with Dr. Judee Clara 08/22/2020.  Has been able to stop smoking.  Is going to possibly use Femara with timed intercourse.  Is with boyfriend who is supportive.    Pt reports pelvic/abdominal pain seems to be more problematic at this time.  Some concerns about how bleeding will change.    IUD string was not visible at last OV.  Bedside ultrasound was used to confirm placement.    Pt aware BP elevated.  Pt states has been this way at many recent visits.  Doesn't feel PCP is monitoring this closely.  Reviewed will need change with pregnancy anyway, so will switch to labetalol and titrate up as needed.  Patient Active Problem List   Diagnosis Date Noted   Fibroids, intramural 06/26/2021   IUD (intrauterine device) in place 06/26/2021   History of smoking 05/10/2021   Menorrhagia with irregular cycle 04/25/2020   Adenomyoma 01/08/2020   History of major depression 03/05/2019   Essential hypertension 03/05/2019   PCOS (polycystic ovarian syndrome) 03/05/2019   Adenomyosis 01/06/2019   Past Medical History:  Diagnosis Date   Allergy    Anemia    Anxiety    Arthritis    Complication of anesthesia    04/2020 [facility] - woke up and was told had asthma attack    Depression    GERD (gastroesophageal reflux disease)    Hypertension    Menorrhagia    adnomyosis   PCOS (polycystic ovarian syndrome)    Pneumonia    hx of 2011    STD (sexually transmitted disease)    trichomonas   Current Outpatient Medications on File Prior to Visit  Medication Sig Dispense Refill   albuterol (VENTOLIN HFA) 108 (90 Base) MCG/ACT inhaler Inhale 2 puffs into the lungs every 4 (four) hours as needed for wheezing or shortness of breath. 18 g 0   buPROPion (WELLBUTRIN XL) 150 MG 24 hr tablet Take 150 mg by mouth at bedtime.     gabapentin  (NEURONTIN) 300 MG capsule Take 1 capsule (300 mg total) by mouth at bedtime. 30 capsule 1   ibuprofen (ADVIL) 800 MG tablet Take 1 tablet (800 mg total) by mouth every 8 (eight) hours as needed. 30 tablet 1   metFORMIN (GLUCOPHAGE) 500 MG tablet Take 1 tablet (500 mg total) by mouth daily with breakfast. 90 tablet 3   sertraline (ZOLOFT) 100 MG tablet Take 1 tablet (100 mg total) by mouth daily. 30 tablet 2   traZODone (DESYREL) 100 MG tablet Take by mouth.     No current facility-administered medications on file prior to visit.   Lisinopril and Mobic [meloxicam]  Review of Systems  Constitutional: Negative.   Genitourinary: Negative.        Intermittent pelvic pain  Vitals:   08/29/21 1601  BP: (!) 144/86  Pulse: 88  Weight: 234 lb 6.4 oz (106.3 kg)  Height: '5\' 8"'$  (1.727 m)    Gen:  WNWF healthy female NAD Abdomen: soft, non-tender Groin:  no inguinal nodes palpated  Pelvic exam: Vulva:  normal female genitalia Vagina:  not evaluated Cervix:  Non-tender, Negative CMT, no lesions or redness.  IUD string not visualized  Procedure:  Speculum reinserted.  Cervix visualized and cleansed with Betadine x 3.  Pt nervous and uncomfortable today just with speculum and touching cervix with  scopette swabs.  Procedure not attempted.  Speculum removed.  Will give oral sedation and plan with ultrasound guidance.    Assessment/Plan. 1. Intrauterine contraceptive device threads lost, subsequent encounter - will return for ultrasound guidance with IUD removal - LORazepam (ATIVAN) 1 MG tablet; Take 1 tablet one hour prior to procedure and bring remaining tablets with you to the procedure appointment  Dispense: 5 tablet; Refill: 0  2. 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 Judee Clara at [facility]  3. Essential hypertension - will change medication in preparation for pregnancy - labetalol (NORMODYNE) 100 MG tablet; Take 1 tablet (100 mg total) by mouth 2 (two) times daily.  Dispense: 60 tablet;  Refill: 2  4. Irregular periods/menstrual cycles - pt will need to stop when starts trying for pregnancy - norethindrone (AYGESTIN) 5 MG tablet; Take 1 tablet (5 mg total) by mouth in the morning and at bedtime.  Dispense: 60 tablet; Refill: 4

## 2021-09-04 ENCOUNTER — Encounter (HOSPITAL_BASED_OUTPATIENT_CLINIC_OR_DEPARTMENT_OTHER): Payer: Self-pay

## 2021-09-25 ENCOUNTER — Encounter (HOSPITAL_BASED_OUTPATIENT_CLINIC_OR_DEPARTMENT_OTHER): Payer: Self-pay

## 2021-09-26 ENCOUNTER — Encounter (HOSPITAL_BASED_OUTPATIENT_CLINIC_OR_DEPARTMENT_OTHER): Payer: Self-pay

## 2021-09-28 ENCOUNTER — Encounter (HOSPITAL_BASED_OUTPATIENT_CLINIC_OR_DEPARTMENT_OTHER): Payer: Self-pay

## 2021-10-04 ENCOUNTER — Ambulatory Visit (INDEPENDENT_AMBULATORY_CARE_PROVIDER_SITE_OTHER): Payer: 59

## 2021-10-04 ENCOUNTER — Ambulatory Visit (INDEPENDENT_AMBULATORY_CARE_PROVIDER_SITE_OTHER): Payer: 59 | Admitting: Obstetrics & Gynecology

## 2021-10-04 ENCOUNTER — Other Ambulatory Visit: Payer: Self-pay

## 2021-10-04 DIAGNOSIS — Z30432 Encounter for removal of intrauterine contraceptive device: Secondary | ICD-10-CM

## 2021-10-04 DIAGNOSIS — T8332XA Displacement of intrauterine contraceptive device, initial encounter: Secondary | ICD-10-CM

## 2021-10-04 DIAGNOSIS — N979 Female infertility, unspecified: Secondary | ICD-10-CM | POA: Diagnosis not present

## 2021-10-04 DIAGNOSIS — D369 Benign neoplasm, unspecified site: Secondary | ICD-10-CM | POA: Diagnosis not present

## 2021-10-04 DIAGNOSIS — N921 Excessive and frequent menstruation with irregular cycle: Secondary | ICD-10-CM | POA: Diagnosis not present

## 2021-10-04 DIAGNOSIS — T8332XD Displacement of intrauterine contraceptive device, subsequent encounter: Secondary | ICD-10-CM

## 2021-10-07 ENCOUNTER — Encounter (HOSPITAL_BASED_OUTPATIENT_CLINIC_OR_DEPARTMENT_OTHER): Payer: Self-pay | Admitting: Obstetrics & Gynecology

## 2021-10-07 NOTE — Progress Notes (Signed)
40 y.o. G40P0020 Single African American female presents for removal of Mirena IUD and ultrasound.  Pt has non visualized IUD string.  She has not had any imaging to confirm presence of IUD so this is being done today as well.  Pt is seeing Dr. Judee Clara at [place] for desired pregnancy.  Boyfriend accompanies her today.  She is starting on Femara and will be doing timed intercourse.  She does have questions today and wants me to explain the process to him.    Ultrasound today showed uterus 8.6 x 4.3 x 5.1cm.  posterior 1.5 x 1.5cm fibroid noted.  Second area that appears more like a fibroid today measured 2.3. x 2.3cm.  On prior MRI, this appears to be adenomyosis.    Patient Active Problem List   Diagnosis Date Noted   Fibroids, intramural 06/26/2021   IUD (intrauterine device) in place 06/26/2021   History of smoking 05/10/2021   Menorrhagia with irregular cycle 04/25/2020   Adenomyoma 01/08/2020   History of major depression 03/05/2019   Essential hypertension 03/05/2019   PCOS (polycystic ovarian syndrome) 03/05/2019   Adenomyosis 01/06/2019   Past Medical History:  Diagnosis Date   Allergy    Anemia    Anxiety    Arthritis    Complication of anesthesia    04/2020 [facility] - woke up and was told had asthma attack    Depression    GERD (gastroesophageal reflux disease)    Hypertension    Menorrhagia    adnomyosis   PCOS (polycystic ovarian syndrome)    Pneumonia    hx of 2011    STD (sexually transmitted disease)    trichomonas   Current Outpatient Medications on File Prior to Visit  Medication Sig Dispense Refill   albuterol (VENTOLIN HFA) 108 (90 Base) MCG/ACT inhaler Inhale 2 puffs into the lungs every 4 (four) hours as needed for wheezing or shortness of breath. 18 g 0   buPROPion (WELLBUTRIN XL) 150 MG 24 hr tablet Take 150 mg by mouth at bedtime.     gabapentin (NEURONTIN) 300 MG capsule Take 1 capsule (300 mg total) by mouth at bedtime. 30 capsule 1   ibuprofen  (ADVIL) 800 MG tablet Take 1 tablet (800 mg total) by mouth every 8 (eight) hours as needed. 30 tablet 1   labetalol (NORMODYNE) 100 MG tablet Take 1 tablet (100 mg total) by mouth 2 (two) times daily. 60 tablet 2   LORazepam (ATIVAN) 1 MG tablet Take 1 tablet one hour prior to procedure and bring remaining tablets with you to the procedure appointment 5 tablet 0   metFORMIN (GLUCOPHAGE) 500 MG tablet Take 1 tablet (500 mg total) by mouth daily with breakfast. 90 tablet 3   norethindrone (AYGESTIN) 5 MG tablet Take 1 tablet (5 mg total) by mouth in the morning and at bedtime. 60 tablet 4   sertraline (ZOLOFT) 100 MG tablet Take 1 tablet (100 mg total) by mouth daily. 30 tablet 2   traZODone (DESYREL) 100 MG tablet Take by mouth.     No current facility-administered medications on file prior to visit.   Lisinopril and Mobic [meloxicam]  Review of Systems  Genitourinary:        Irregular bleeding   Gen:  WNWF healthy female NAD Groin:  no inguinal nodes palpated  Pelvic exam: Vulva:  normal female genitalia Vagina:  normal vagina Cervix:  Non-tender, Negative CMT, no lesions or redness. Uterus:  normal shape, position and consistency   Procedure:  Speculum reinserted.  Cervix visualized and cleansed with Betadine x 3.  Single toothed tenaculum applied to anterior lip of cervix without difficulty.  Using ultrasound guidance, endometrial curetted passed within endometrial cavity.  Device rotated in clockwise fashion and removed.  IUD removed intact with third attempt.  Pt has some mild cramping but tolerated this well.  After procedure, pt and her significant other and I discussed the results of ultrasound as well as the process for fertility at this time.  Adenomyosis and uterine fibroids as well as relation to bleeding discussed.  Questions addressed.    Assessment/Plan: 1. Encounter for IUD removal - pt is going to contact Dr. Judee Clara and let him know IUD was successfully removed  2.  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 Adenomyoma  4. Menorrhagia with irregular cycle

## 2022-02-05 ENCOUNTER — Telehealth (HOSPITAL_BASED_OUTPATIENT_CLINIC_OR_DEPARTMENT_OTHER): Payer: Self-pay | Admitting: *Deleted

## 2022-02-05 NOTE — Telephone Encounter (Signed)
LMOVM for pt to call office regarding refill request we received from her pharmacy

## 2022-08-07 ENCOUNTER — Encounter (HOSPITAL_BASED_OUTPATIENT_CLINIC_OR_DEPARTMENT_OTHER): Payer: Self-pay | Admitting: Obstetrics & Gynecology

## 2022-08-07 ENCOUNTER — Other Ambulatory Visit (HOSPITAL_COMMUNITY)
Admission: RE | Admit: 2022-08-07 | Discharge: 2022-08-07 | Disposition: A | Discharge status: Home / Self Care | Payer: 59 | Source: Ambulatory Visit | Attending: Obstetrics & Gynecology | Admitting: Obstetrics & Gynecology

## 2022-08-07 ENCOUNTER — Ambulatory Visit (INDEPENDENT_AMBULATORY_CARE_PROVIDER_SITE_OTHER): Payer: 59 | Admitting: Obstetrics & Gynecology

## 2022-08-07 VITALS — BP 133/82 | HR 95 | Ht 67.5 in | Wt 238.6 lb

## 2022-08-07 DIAGNOSIS — Z124 Encounter for screening for malignant neoplasm of cervix: Secondary | ICD-10-CM | POA: Insufficient documentation

## 2022-08-07 DIAGNOSIS — Z202 Contact with and (suspected) exposure to infections with a predominantly sexual mode of transmission: Secondary | ICD-10-CM

## 2022-08-07 DIAGNOSIS — Z3041 Encounter for surveillance of contraceptive pills: Secondary | ICD-10-CM | POA: Diagnosis not present

## 2022-08-07 NOTE — Progress Notes (Signed)
GYNECOLOGY  VISIT  CC:   discuss possible medication change  HPI: 41 y.o. G32P0020 Single Black or African American female here for discussion of changing back to combination OCP.  Pt is still smoking and so this is not an option.  Also, has hypertension so not likely.  Has decided not to proceed with pregnancy at this time.  Not ready for hysterectomy.    Reports she recently has CK value that was >1000.  Is going to have additional evaluation done.  She's felt like back and muscular issues were related to her menstrual bleeding but she is aware I have not been convinced of this.  Now she is wondering if she has a muscular issue.  Not sure about what the evaluation will be.  Pt is due a pap smear.  Offered STI screening.  She just wanted HIV testing today and GC/Chl as well.  Pt aware this will be done with pap smear.   Past Medical History:  Diagnosis Date   Allergy    Anemia    Anxiety    Arthritis    Complication of anesthesia    04/2020 [facility] - woke up and was told had asthma attack    Depression    GERD (gastroesophageal reflux disease)    Hypertension    Menorrhagia    adnomyosis   PCOS (polycystic ovarian syndrome)    Pneumonia    hx of 2011    STD (sexually transmitted disease)    trichomonas    MEDS:   Current Outpatient Medications on File Prior to Visit  Medication Sig Dispense Refill   albuterol (VENTOLIN HFA) 108 (90 Base) MCG/ACT inhaler Inhale 2 puffs into the lungs every 4 (four) hours as needed for wheezing or shortness of breath. 18 g 0   buPROPion (WELLBUTRIN XL) 150 MG 24 hr tablet Take 150 mg by mouth at bedtime.     ibuprofen (ADVIL) 800 MG tablet Take 1 tablet (800 mg total) by mouth every 8 (eight) hours as needed. 30 tablet 1   labetalol (NORMODYNE) 100 MG tablet Take 1 tablet (100 mg total) by mouth 2 (two) times daily. 60 tablet 2   metFORMIN (GLUCOPHAGE) 500 MG tablet Take 1 tablet (500 mg total) by mouth daily with breakfast. 90 tablet 3    norethindrone (AYGESTIN) 5 MG tablet Take 1 tablet (5 mg total) by mouth in the morning and at bedtime. 60 tablet 4   traZODone (DESYREL) 100 MG tablet Take by mouth.     LORazepam (ATIVAN) 1 MG tablet Take 1 tablet one hour prior to procedure and bring remaining tablets with you to the procedure appointment (Patient not taking: Reported on 08/07/2022) 5 tablet 0   No current facility-administered medications on file prior to visit.    ALLERGIES: Lisinopril and Mobic [meloxicam]  SH:  single, smoking  Review of Systems  Constitutional: Negative.   Gastrointestinal: Negative.     PHYSICAL EXAMINATION:    BP 133/82 (BP Location: Left Arm, Patient Position: Sitting, Cuff Size: Large)   Pulse 95   Ht 5' 7.5" (1.715 m) Comment: Reported  Wt 238 lb 9.6 oz (108.2 kg)   BMI 36.82 kg/m     General appearance: alert, cooperative and appears stated age Lymph:  no inguinal LAD noted  Pelvic: External genitalia:  no lesions              Urethra:  normal appearing urethra with no masses, tenderness or lesions  Bartholins and Skenes: normal                 Vagina: normal appearing vagina with normal color and discharge, no lesions              Cervix: no lesions              Bimanual Exam:  Uterus:  normal size, contour, position, consistency, mobility, non-tender             Chaperone, Ezekiel Ina, RN, was present for exam.  Assessment/Plan: 1. Encounter for surveillance of contraceptive pills - POP currently recommended for her.    2. Cervical cancer screening - Cytology - PAP( [place]) - HIV Antibody (routine testing w rflx)  3. STD exposure - HIV Antibody (routine testing w rflx)

## 2022-08-08 LAB — HIV ANTIBODY (ROUTINE TESTING W REFLEX): HIV Screen 4th Generation wRfx: NONREACTIVE

## 2022-08-10 LAB — CYTOLOGY - PAP
Adequacy: ABSENT
Chlamydia: NEGATIVE
Comment: NEGATIVE
Comment: NEGATIVE
Comment: NORMAL
Diagnosis: NEGATIVE
High risk HPV: NEGATIVE
Neisseria Gonorrhea: NEGATIVE

## 2022-08-23 ENCOUNTER — Encounter (HOSPITAL_BASED_OUTPATIENT_CLINIC_OR_DEPARTMENT_OTHER): Payer: Self-pay | Admitting: Obstetrics & Gynecology

## 2022-08-23 MED ORDER — SLYND 4 MG PO TABS: 1.0000 | ORAL_TABLET | Freq: Every day | ORAL | 1 refills | Status: DC

## 2022-08-24 ENCOUNTER — Encounter (HOSPITAL_BASED_OUTPATIENT_CLINIC_OR_DEPARTMENT_OTHER): Payer: Self-pay | Admitting: Obstetrics & Gynecology

## 2022-10-09 ENCOUNTER — Other Ambulatory Visit (HOSPITAL_BASED_OUTPATIENT_CLINIC_OR_DEPARTMENT_OTHER): Payer: Self-pay | Admitting: Obstetrics & Gynecology

## 2022-10-09 ENCOUNTER — Encounter (HOSPITAL_BASED_OUTPATIENT_CLINIC_OR_DEPARTMENT_OTHER): Payer: Self-pay | Admitting: Obstetrics & Gynecology

## 2022-12-15 ENCOUNTER — Other Ambulatory Visit (HOSPITAL_BASED_OUTPATIENT_CLINIC_OR_DEPARTMENT_OTHER): Payer: Self-pay | Admitting: Obstetrics & Gynecology

## 2023-01-09 ENCOUNTER — Encounter (HOSPITAL_BASED_OUTPATIENT_CLINIC_OR_DEPARTMENT_OTHER): Payer: Self-pay | Admitting: Obstetrics & Gynecology

## 2023-01-16 ENCOUNTER — Other Ambulatory Visit (HOSPITAL_BASED_OUTPATIENT_CLINIC_OR_DEPARTMENT_OTHER): Payer: Self-pay | Admitting: Obstetrics & Gynecology

## 2023-01-16 MED ORDER — NORETHINDRONE 0.35 MG PO TABS: 1.0000 | ORAL_TABLET | Freq: Every day | ORAL | 3 refills | Status: DC

## 2023-02-08 ENCOUNTER — Other Ambulatory Visit (HOSPITAL_BASED_OUTPATIENT_CLINIC_OR_DEPARTMENT_OTHER): Payer: Self-pay | Admitting: Obstetrics & Gynecology

## 2023-02-08 DIAGNOSIS — I10 Essential (primary) hypertension: Secondary | ICD-10-CM

## 2023-02-08 MED ORDER — NORETHINDRONE 0.35 MG PO TABS: 1.0000 | ORAL_TABLET | Freq: Every day | ORAL | 3 refills | Status: DC

## 2023-02-11 ENCOUNTER — Other Ambulatory Visit (HOSPITAL_BASED_OUTPATIENT_CLINIC_OR_DEPARTMENT_OTHER): Payer: Self-pay | Admitting: Obstetrics & Gynecology

## 2023-02-11 DIAGNOSIS — Z8742 Personal history of other diseases of the female genital tract: Secondary | ICD-10-CM

## 2023-02-11 MED ORDER — SLYND 4 MG PO TABS: 1.0000 | ORAL_TABLET | Freq: Every day | ORAL | 6 refills | Status: DC

## 2023-05-27 ENCOUNTER — Encounter (HOSPITAL_BASED_OUTPATIENT_CLINIC_OR_DEPARTMENT_OTHER): Payer: Self-pay | Admitting: Obstetrics & Gynecology

## 2023-05-30 ENCOUNTER — Telehealth (INDEPENDENT_AMBULATORY_CARE_PROVIDER_SITE_OTHER): Payer: 59 | Admitting: Obstetrics & Gynecology

## 2023-05-30 DIAGNOSIS — Z87891 Personal history of nicotine dependence: Secondary | ICD-10-CM

## 2023-05-30 DIAGNOSIS — N926 Irregular menstruation, unspecified: Secondary | ICD-10-CM | POA: Diagnosis not present

## 2023-05-30 DIAGNOSIS — R634 Abnormal weight loss: Secondary | ICD-10-CM | POA: Diagnosis not present

## 2023-05-30 DIAGNOSIS — Z8679 Personal history of other diseases of the circulatory system: Secondary | ICD-10-CM | POA: Diagnosis not present

## 2023-05-30 DIAGNOSIS — Z9884 Bariatric surgery status: Secondary | ICD-10-CM

## 2023-05-30 MED ORDER — NORETHIN ACE-ETH ESTRAD-FE 1-20 MG-MCG PO TABS
1.0000 | ORAL_TABLET | Freq: Every day | ORAL | 1 refills | Status: DC
Start: 2023-05-30 — End: 2023-10-22

## 2023-05-30 NOTE — Progress Notes (Signed)
Virtual Visit via Video Note  I connected with Jamie Chung on 05/30/23 at 11:15 AM EDT by a video enabled telemedicine application and verified that I am speaking with the correct person using two identifiers.  Location: Patient: hone Provider: office   I discussed the limitations of evaluation and management by telemedicine and the availability of in person appointments. The patient expressed understanding and agreed to proceed.  History of Present Illness: 42 yo G3A2 AA female for discussion of irregular bleeding.  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  She has already lost enough weight that she's come off her blood pressure medication and also stopped smoking before her surgery.  She thinks this was due to some weight loss that occurred with dietary changes prior to surgery.  However, since the surgery, she's had menstrual bleeding every day.  She has been on a POP but she used to take a combination OCP in the past.  This was stopped after age 77 due to smoking hx and then she developed hypertension.  Reviewed notes from the last several months at [facility] and do see where it was recommended that she stop her anti-hypertensive medication.  I think, given the amount of irregular bleeding, and the risk reduction she has taken with weight loss and smoking cessation, that it is reasonable to start a combination OCP to get better bleeding control.  Risks discussed with pt in detail including DUB, DVT/PE, headache, nausea, increased BP.  She does need to continue to check blood pressures.     Observations/Objective: WNWD WF, NAD  Assessment and Plan: 1. Irregular bleeding - pt can stop POP and start combination OCP now - norethindrone-ethinyl estradiol-FE (LOESTRIN FE) 1-20 MG-MCG tablet; Take 1 tablet by mouth daily.  Dispense: 84 tablet; Refill: 1 - pt advised to monitor blood pressure and if any values >135/85, I need to be aware - if she starts smoking again, I need to be  aware  2. History of hypertension - off anti-hypertensive medications  3. Weight loss - will continue to have follow up with bariatric surgeon/office regularly during this next year  4. Former smoker - Child psychotherapist regarding cessation   Follow Up Instructions: I discussed the assessment and treatment plan with the patient. The patient was provided an opportunity to ask questions and all were answered. The patient agreed with the plan and demonstrated an understanding of the instructions.   The patient was advised to call back or seek an in-person evaluation if the symptoms worsen or if the condition fails to improve as anticipated.  I provided 22 minutes of non-face-to-face time during this encounter.   Jerene Bears, MD

## 2023-06-05 ENCOUNTER — Encounter (HOSPITAL_BASED_OUTPATIENT_CLINIC_OR_DEPARTMENT_OTHER): Payer: Self-pay | Admitting: Obstetrics & Gynecology

## 2023-06-05 DIAGNOSIS — Z8679 Personal history of other diseases of the circulatory system: Secondary | ICD-10-CM | POA: Insufficient documentation

## 2023-06-05 DIAGNOSIS — Z87891 Personal history of nicotine dependence: Secondary | ICD-10-CM | POA: Insufficient documentation

## 2023-06-12 ENCOUNTER — Telehealth (HOSPITAL_BASED_OUTPATIENT_CLINIC_OR_DEPARTMENT_OTHER): Payer: Self-pay | Admitting: *Deleted

## 2023-06-12 NOTE — Telephone Encounter (Signed)
LMOVM for pt to call office to set up appt for 3 month OCP follow up

## 2023-07-17 ENCOUNTER — Ambulatory Visit (HOSPITAL_BASED_OUTPATIENT_CLINIC_OR_DEPARTMENT_OTHER): Payer: 59

## 2023-07-17 ENCOUNTER — Other Ambulatory Visit (HOSPITAL_COMMUNITY)
Admission: RE | Admit: 2023-07-17 | Discharge: 2023-07-17 | Disposition: A | Discharge status: Home / Self Care | Payer: 59 | Source: Ambulatory Visit | Attending: Obstetrics & Gynecology | Admitting: Obstetrics & Gynecology

## 2023-07-17 ENCOUNTER — Encounter (HOSPITAL_BASED_OUTPATIENT_CLINIC_OR_DEPARTMENT_OTHER): Payer: Self-pay

## 2023-07-17 VITALS — BP 136/99 | HR 83 | Ht 67.5 in | Wt 210.4 lb

## 2023-07-17 DIAGNOSIS — N898 Other specified noninflammatory disorders of vagina: Secondary | ICD-10-CM | POA: Insufficient documentation

## 2023-07-17 NOTE — Progress Notes (Signed)
Patient came in today to give a sample for aptima self swab. She is complaining of vaginal discharge and odor. tbw

## 2023-07-18 ENCOUNTER — Encounter (HOSPITAL_BASED_OUTPATIENT_CLINIC_OR_DEPARTMENT_OTHER): Payer: Self-pay | Admitting: Obstetrics & Gynecology

## 2023-07-18 ENCOUNTER — Other Ambulatory Visit: Payer: Self-pay | Admitting: Obstetrics and Gynecology

## 2023-07-18 ENCOUNTER — Other Ambulatory Visit (HOSPITAL_BASED_OUTPATIENT_CLINIC_OR_DEPARTMENT_OTHER): Payer: Self-pay | Admitting: *Deleted

## 2023-07-18 DIAGNOSIS — B9689 Other specified bacterial agents as the cause of diseases classified elsewhere: Secondary | ICD-10-CM

## 2023-07-18 MED ORDER — METRONIDAZOLE 500 MG PO TABS
500.0000 mg | ORAL_TABLET | Freq: Two times a day (BID) | ORAL | 0 refills | Status: AC
Start: 2023-07-18 — End: 2023-07-25

## 2023-07-21 ENCOUNTER — Encounter (HOSPITAL_BASED_OUTPATIENT_CLINIC_OR_DEPARTMENT_OTHER): Payer: Self-pay | Admitting: Emergency Medicine

## 2023-07-21 ENCOUNTER — Emergency Department (HOSPITAL_BASED_OUTPATIENT_CLINIC_OR_DEPARTMENT_OTHER)
Admission: EM | Admit: 2023-07-21 | Discharge: 2023-07-21 | Disposition: A | Discharge status: Home / Self Care | Payer: 59 | Attending: Emergency Medicine | Admitting: Emergency Medicine

## 2023-07-21 ENCOUNTER — Other Ambulatory Visit: Payer: Self-pay

## 2023-07-21 DIAGNOSIS — N898 Other specified noninflammatory disorders of vagina: Secondary | ICD-10-CM | POA: Diagnosis not present

## 2023-07-21 DIAGNOSIS — R112 Nausea with vomiting, unspecified: Secondary | ICD-10-CM | POA: Diagnosis not present

## 2023-07-21 LAB — URINALYSIS, ROUTINE W REFLEX MICROSCOPIC
Bacteria, UA: NONE SEEN
Bilirubin Urine: NEGATIVE
Glucose, UA: NEGATIVE mg/dL
Hgb urine dipstick: NEGATIVE
Ketones, ur: 15 mg/dL — AB
Nitrite: NEGATIVE
Specific Gravity, Urine: 1.035 — ABNORMAL HIGH (ref 1.005–1.030)
pH: 6 (ref 5.0–8.0)

## 2023-07-21 LAB — COMPREHENSIVE METABOLIC PANEL
ALT: 13 U/L (ref 0–44)
AST: 20 U/L (ref 15–41)
Albumin: 4.1 g/dL (ref 3.5–5.0)
Alkaline Phosphatase: 71 U/L (ref 38–126)
Anion gap: 11 (ref 5–15)
BUN: 9 mg/dL (ref 6–20)
CO2: 21 mmol/L — ABNORMAL LOW (ref 22–32)
Calcium: 9.5 mg/dL (ref 8.9–10.3)
Chloride: 106 mmol/L (ref 98–111)
Creatinine, Ser: 0.89 mg/dL (ref 0.44–1.00)
GFR, Estimated: >60 mL/min (ref >60)
Glucose, Bld: 135 mg/dL — ABNORMAL HIGH (ref 70–99)
Potassium: 3.7 mmol/L (ref 3.5–5.1)
Sodium: 138 mmol/L (ref 135–145)
Total Bilirubin: 0.3 mg/dL (ref 0.3–1.2)
Total Protein: 7.1 g/dL (ref 6.5–8.1)

## 2023-07-21 LAB — CBC
HCT: 39.4 % (ref 36.0–46.0)
Hemoglobin: 13.6 g/dL (ref 12.0–15.0)
MCH: 30.9 pg (ref 26.0–34.0)
MCHC: 34.5 g/dL (ref 30.0–36.0)
MCV: 89.5 fL (ref 80.0–100.0)
Platelets: 251 10*3/uL (ref 150–400)
RBC: 4.4 MIL/uL (ref 3.87–5.11)
RDW: 12.9 % (ref 11.5–15.5)
WBC: 7 10*3/uL (ref 4.0–10.5)
nRBC: 0 % (ref 0.0–0.2)

## 2023-07-21 LAB — LIPASE, BLOOD: Lipase: 34 U/L (ref 11–51)

## 2023-07-21 LAB — CK: Total CK: 214 U/L (ref 38–234)

## 2023-07-21 MED ORDER — ONDANSETRON HCL 4 MG/2ML IJ SOLN
4.0000 mg | Freq: Once | INTRAMUSCULAR | Status: AC
  Administered 2023-07-21: 4 mg via INTRAVENOUS
  Filled 2023-07-21: qty 2

## 2023-07-21 MED ORDER — SODIUM CHLORIDE 0.9 % IV BOLUS
1000.0000 mL | Freq: Once | INTRAVENOUS | Status: AC
  Administered 2023-07-21: 1000 mL via INTRAVENOUS

## 2023-07-21 MED ORDER — ONDANSETRON 4 MG PO TBDP: 4.0000 mg | ORAL_TABLET | Freq: Three times a day (TID) | ORAL | 0 refills | Status: AC | PRN

## 2023-07-21 NOTE — ED Triage Notes (Signed)
Abdominal pain and nausea. Started on flagyl a few days ago for BV. Seen at [facility], sent for further workup  Took ibuprofen around 3:30pm

## 2023-07-21 NOTE — ED Provider Notes (Signed)
[place] EMERGENCY DEPARTMENT AT [facility] Provider Note   CSN: [id number] Arrival date & time: 07/21/23  4098     History  Chief Complaint  Patient presents with   Abdominal Pain    Jamie Chung is a 42 y.o. female.  Patient presents in   Abdominal Pain Nausea vomiting asked about abdominal pain.  Recently treated with Flagyl for BV.  Had some vaginal discharge.  Now some abdominal cramping and nausea and vomiting.  Thought to be secondary to Flagyl.  However went to urgent care and and told to come here for potential acute kidney injury and potential rhabdomyolysis.  Has had previous elevated CKs but had back pain at that time.  I reviewed the note and the urinalysis from urgent care.  Urine did have some protein and was somewhat concentrated.     Home Medications Prior to Admission medications   Medication Sig Start Date End Date Taking? Authorizing Provider  ondansetron (ZOFRAN-ODT) 4 MG disintegrating tablet Take 1 tablet (4 mg total) by mouth every 8 (eight) hours as needed. 07/21/23  Yes Benjiman Core, MD  albuterol (VENTOLIN HFA) 108 (90 Base) MCG/ACT inhaler Inhale 2 puffs into the lungs every 4 (four) hours as needed for wheezing or shortness of breath. 11/08/19   Hall-Potvin, [place], PA-C  buPROPion (WELLBUTRIN XL) 150 MG 24 hr tablet Take 150 mg by mouth at bedtime. 09/01/20   [provider]  LORazepam (ATIVAN) 1 MG tablet Take 1 tablet one hour prior to procedure and bring remaining tablets with you to the procedure appointment Patient not taking: Reported on 08/07/2022 08/29/21   Jerene Bears, MD  metroNIDAZOLE (FLAGYL) 500 MG tablet Take 1 tablet (500 mg total) by mouth 2 (two) times daily for 7 days. 07/18/23 07/25/23  Lorriane Shire, MD  norethindrone (NORLYDA) 0.35 MG tablet Take 1 tablet (0.35 mg total) by mouth daily. 02/08/23   Jerene Bears, MD  norethindrone-ethinyl estradiol-FE (LOESTRIN FE) 1-20 MG-MCG tablet Take 1 tablet by  mouth daily. 05/30/23   Jerene Bears, MD  omeprazole (PRILOSEC) 20 MG capsule Take 20 mg by mouth daily. 04/08/23 04/07/24  [provider]  senna-docusate (SENOKOT-S) 8.6-50 MG tablet Take 1 tablet by mouth 2 (two) times daily. 04/08/23 10/05/23  [provider]  traZODone (DESYREL) 100 MG tablet Take by mouth. 05/31/20   [provider]  ursodiol (ACTIGALL) 250 MG tablet Take 250 mg by mouth 2 (two) times daily. 04/08/23   [provider]      Allergies    Lisinopril and Mobic [meloxicam]    Review of Systems   Review of Systems  Gastrointestinal:  Positive for abdominal pain.    Physical Exam Updated Vital Signs BP (!) 139/91   Pulse 77   Temp 98.7 F (37.1 C) (Oral)   Resp 18   LMP 07/06/2023 (Approximate)   SpO2 100%  Physical Exam Vitals and nursing note reviewed.  Cardiovascular:     Rate and Rhythm: Regular rhythm.  Abdominal:     Tenderness: There is no abdominal tenderness.  Skin:    General: Skin is warm.  Neurological:     Mental Status: She is alert.     ED Results / Procedures / Treatments   Labs (all labs ordered are listed, but only abnormal results are displayed) Labs Reviewed  URINALYSIS, ROUTINE W REFLEX MICROSCOPIC - Abnormal; Notable for the following components:      Result Value   Specific Gravity, Urine 1.035 (*)  Ketones, ur 15 (*)    Protein, ur TRACE (*)    Leukocytes,Ua TRACE (*)    All other components within normal limits  COMPREHENSIVE METABOLIC PANEL - Abnormal; Notable for the following components:   CO2 21 (*)    Glucose, Bld 135 (*)    All other components within normal limits  CBC  LIPASE, BLOOD  CK    EKG None  Radiology No results found.  Procedures Procedures    Medications Ordered in ED Medications  ondansetron [facility]) injection 4 mg (4 mg Intravenous Given 07/21/23 2008)  sodium chloride 0.9 % bolus 1,000 mL (0 mLs Intravenous Stopped 07/21/23 2125)    ED Course/ Medical  Decision Making/ A&P                                 Medical Decision Making Amount and/or Complexity of Data Reviewed Labs: ordered.  Risk Prescription drug management.   Patient with nausea and vomiting.  Mild abdominal crampy.  Benign abdominal exam however.  Discussed with patient doubt severe intra-abdominal pathology.  Likely related to medication.  Blood work is overall reassuring.  Urine does not show infection.  Discussed with patient she requested repeat CK with symptoms she had before.  CK done and was normal.  Discussed with patient about switching to metronidazole gel versus continuing the Flagyl pills.  Request to keep pills going but will add Zofran.  Appears stable for discharge home.        Final Clinical Impression(s) / ED Diagnoses Final diagnoses:  Nausea and vomiting, unspecified vomiting type    Rx / DC Orders ED Discharge Orders          Ordered    ondansetron (ZOFRAN-ODT) 4 MG disintegrating tablet  Every 8 hours PRN        07/21/23 2141              Benjiman Core, MD 07/21/23 2147

## 2023-09-18 ENCOUNTER — Encounter (HOSPITAL_BASED_OUTPATIENT_CLINIC_OR_DEPARTMENT_OTHER): Payer: Self-pay | Admitting: Obstetrics & Gynecology

## 2023-09-19 ENCOUNTER — Ambulatory Visit (HOSPITAL_BASED_OUTPATIENT_CLINIC_OR_DEPARTMENT_OTHER): Payer: 59 | Admitting: Obstetrics & Gynecology

## 2023-09-25 ENCOUNTER — Encounter (HOSPITAL_BASED_OUTPATIENT_CLINIC_OR_DEPARTMENT_OTHER): Payer: Self-pay | Admitting: Obstetrics & Gynecology

## 2023-09-26 ENCOUNTER — Ambulatory Visit (HOSPITAL_BASED_OUTPATIENT_CLINIC_OR_DEPARTMENT_OTHER): Payer: 59 | Admitting: Obstetrics & Gynecology

## 2023-10-21 ENCOUNTER — Other Ambulatory Visit (HOSPITAL_BASED_OUTPATIENT_CLINIC_OR_DEPARTMENT_OTHER): Payer: Self-pay | Admitting: Obstetrics & Gynecology

## 2023-10-21 DIAGNOSIS — N926 Irregular menstruation, unspecified: Secondary | ICD-10-CM

## 2023-10-30 ENCOUNTER — Encounter (HOSPITAL_BASED_OUTPATIENT_CLINIC_OR_DEPARTMENT_OTHER): Payer: Self-pay | Admitting: *Deleted

## 2023-10-30 ENCOUNTER — Ambulatory Visit (HOSPITAL_BASED_OUTPATIENT_CLINIC_OR_DEPARTMENT_OTHER): Payer: 59 | Admitting: *Deleted

## 2023-10-30 ENCOUNTER — Other Ambulatory Visit (HOSPITAL_COMMUNITY)
Admission: RE | Admit: 2023-10-30 | Discharge: 2023-10-30 | Disposition: A | Discharge status: Home / Self Care | Payer: 59 | Source: Ambulatory Visit | Attending: Obstetrics & Gynecology | Admitting: Obstetrics & Gynecology

## 2023-10-30 ENCOUNTER — Encounter (HOSPITAL_BASED_OUTPATIENT_CLINIC_OR_DEPARTMENT_OTHER): Payer: Self-pay | Admitting: Obstetrics & Gynecology

## 2023-10-30 VITALS — BP 142/86 | HR 74 | Ht 67.5 in | Wt 199.8 lb

## 2023-10-30 DIAGNOSIS — N898 Other specified noninflammatory disorders of vagina: Secondary | ICD-10-CM

## 2023-10-30 DIAGNOSIS — B3731 Acute candidiasis of vulva and vagina: Secondary | ICD-10-CM

## 2023-10-30 NOTE — Progress Notes (Signed)
Pt here for vaginal discharge. Would like to be tested for BV, yeast and STDs. Pt instructed on and performed self swab. Pt advised that we would notify her of results and treat as appropriate once results received. Pt with elevated BP. Denies symptoms. Recheck shows improved BP. Pt reports that she was rushing to this appt.

## 2023-10-31 LAB — CERVICOVAGINAL ANCILLARY ONLY
Bacterial Vaginitis (gardnerella): NEGATIVE
Candida Glabrata: NEGATIVE
Candida Vaginitis: POSITIVE — AB
Chlamydia: NEGATIVE
Comment: NEGATIVE
Comment: NEGATIVE
Comment: NEGATIVE
Comment: NEGATIVE
Comment: NEGATIVE
Comment: NORMAL
Neisseria Gonorrhea: NEGATIVE
Trichomonas: NEGATIVE

## 2023-10-31 MED ORDER — FLUCONAZOLE 150 MG PO TABS: ORAL_TABLET | ORAL | 0 refills | Status: DC

## 2023-10-31 NOTE — Addendum Note (Signed)
Addended by: Jerene Bears on: 10/31/2023 06:09 PM   Modules accepted: Orders

## 2024-05-05 ENCOUNTER — Other Ambulatory Visit (HOSPITAL_BASED_OUTPATIENT_CLINIC_OR_DEPARTMENT_OTHER): Payer: Self-pay

## 2024-05-05 DIAGNOSIS — N926 Irregular menstruation, unspecified: Secondary | ICD-10-CM

## 2024-05-05 MED ORDER — NORETHIN ACE-ETH ESTRAD-FE 1-20 MG-MCG PO TABS
1.0000 | ORAL_TABLET | Freq: Every day | ORAL | 1 refills | Status: AC
Start: 2024-05-05 — End: ?

## 2024-05-08 ENCOUNTER — Other Ambulatory Visit (HOSPITAL_COMMUNITY)
Admission: RE | Admit: 2024-05-08 | Discharge: 2024-05-08 | Disposition: A | Discharge status: Home / Self Care | Source: Ambulatory Visit | Attending: Certified Nurse Midwife | Admitting: Certified Nurse Midwife

## 2024-05-08 ENCOUNTER — Ambulatory Visit (HOSPITAL_BASED_OUTPATIENT_CLINIC_OR_DEPARTMENT_OTHER): Admitting: Certified Nurse Midwife

## 2024-05-08 ENCOUNTER — Encounter (HOSPITAL_BASED_OUTPATIENT_CLINIC_OR_DEPARTMENT_OTHER): Payer: Self-pay | Admitting: Certified Nurse Midwife

## 2024-05-08 VITALS — BP 153/91 | HR 76 | Ht 67.75 in | Wt 194.4 lb

## 2024-05-08 DIAGNOSIS — Z3041 Encounter for surveillance of contraceptive pills: Secondary | ICD-10-CM

## 2024-05-08 DIAGNOSIS — Z01419 Encounter for gynecological examination (general) (routine) without abnormal findings: Secondary | ICD-10-CM | POA: Diagnosis not present

## 2024-05-08 DIAGNOSIS — Z113 Encounter for screening for infections with a predominantly sexual mode of transmission: Secondary | ICD-10-CM | POA: Insufficient documentation

## 2024-05-08 DIAGNOSIS — Z1231 Encounter for screening mammogram for malignant neoplasm of breast: Secondary | ICD-10-CM

## 2024-05-08 MED ORDER — NORETHINDRONE 0.35 MG PO TABS: 1.0000 | ORAL_TABLET | Freq: Every day | ORAL | 3 refills | Status: DC

## 2024-05-08 MED ORDER — NORETHINDRONE 0.35 MG PO TABS
1.0000 | ORAL_TABLET | Freq: Every day | ORAL | 5 refills | Status: AC
Start: 2024-05-08 — End: ?

## 2024-05-08 NOTE — Progress Notes (Signed)
 43 y.o. G57P0020 Single Black or [place] American female here for annual exam. She requests screening for GC/CT/TV but declines serum STI screening. Pt on combination birth control pills but BP 153/91. She has a hx of hypertension but reports BP seemed to normalize after weight loss surgery and losing 60lb. She has smoked but only 5-6 cigarettes since her surgery. Discussed that COC contraindicated with current BP but we can restart Progesterone-Only Pills. Pt reports she needs the pills mostly for menstrual cycle regulation.   Patient's last menstrual period was 05/08/2024.          Sexually active: Yes.    The current method of family planning is oral progesterone-only contraceptive.    Exercising: Yes.     Smoker:  has only smoked ~5 cigarettes since wt loss surgery  Health Maintenance: Pap:  08/07/2022 Negative History of abnormal Pap:  yes MMG:  Ordered Colonoscopy:  at age 52 Screening Labs: PCP monitors labs. Pt will notify her PCP regarding elevated BP and possible need for reinitiation of anti-hypertensives.    reports that she quit smoking about 3 years ago. Her smoking use included cigarettes. She started smoking about 13 years ago. She has a 5 pack-year smoking history. She has never used smokeless tobacco. She reports current alcohol use of about 3.0 standard drinks of alcohol per week. She reports that she does not use drugs.  Past Medical History:  Diagnosis Date   Allergy    Anemia    Anxiety    Arthritis    Complication of anesthesia    04/2020 [facility] - woke up and was told had asthma attack    Depression    GERD (gastroesophageal reflux disease)    Hypertension    Menorrhagia    adnomyosis   PCOS (polycystic ovarian syndrome)    Pneumonia    hx of 2011    STD (sexually transmitted disease)    trichomonas    Past Surgical History:  Procedure Laterality Date   biopsy of cervix     DILATATION & CURETTAGE/HYSTEROSCOPY WITH MYOSURE N/A 05/02/2020    Procedure: DILATATION & CURETTAGE/HYSTEROSCOPY WITH MYOSURE;  Surgeon: Lillian Rein, MD;  Location: [facility] OR;  Service: Gynecology;  Laterality: N/A;   INTRAUTERINE DEVICE (IUD) INSERTION N/A 05/02/2020   Procedure: INTRAUTERINE DEVICE (IUD) INSERTION/MIRENA  IUD;  Surgeon: Lillian Rein, MD;  Location: [facility] OR;  Service: Gynecology;  Laterality: N/A;  Mirena  IUD   LAPAROSCOPIC GASTRIC SLEEVE RESECTION     LUMBAR PUNCTURE     TONSILLECTOMY     WISDOM TOOTH EXTRACTION      Current Outpatient Medications  Medication Sig Dispense Refill   albuterol  (VENTOLIN  HFA) 108 (90 Base) MCG/ACT inhaler Inhale 2 puffs into the lungs every 4 (four) hours as needed for wheezing or shortness of breath. 18 g 0   buPROPion (WELLBUTRIN XL) 150 MG 24 hr tablet Take 150 mg by mouth at bedtime.     fluconazole  (DIFLUCAN ) 150 MG tablet Take 1 tablet and repeat in 72 hours 2 tablet 0   LORazepam  (ATIVAN ) 1 MG tablet Take 1 tablet one hour prior to procedure and bring remaining tablets with you to the procedure appointment (Patient not taking: Reported on 08/07/2022) 5 tablet 0   norethindrone  (NORLYDA ) 0.35 MG tablet Take 1 tablet (0.35 mg total) by mouth daily. 84 tablet 3   norethindrone -ethinyl estradiol-FE (BLISOVI FE 1/20) 1-20 MG-MCG tablet Take 1 tablet by mouth daily. 84 tablet 1   omeprazole (PRILOSEC) 20 MG  capsule Take 20 mg by mouth daily.     ondansetron  (ZOFRAN -ODT) 4 MG disintegrating tablet Take 1 tablet (4 mg total) by mouth every 8 (eight) hours as needed. 8 tablet 0   traZODone (DESYREL) 100 MG tablet Take by mouth.     ursodiol (ACTIGALL) 250 MG tablet Take 250 mg by mouth 2 (two) times daily.     No current facility-administered medications for this visit.    Family History  Problem Relation Age of Onset   Hypertension Mother    Hypertension Maternal Grandmother    Heart attack Maternal Grandfather    Breast cancer Paternal Grandmother    Breast cancer Maternal Aunt 47     ROS: Constitutional: negative Genitourinary:negative  Exam:   BP (!) 149/95 (BP Location: Left Arm, Patient Position: Sitting, Cuff Size: Large)   Pulse 76   Ht 5' 7.75" (1.721 m)   Wt 194 lb 6.4 oz (88.2 kg)   LMP 05/08/2024   BMI 29.78 kg/m   Height: 5' 7.75" (172.1 cm)  General appearance: alert, cooperative and appears stated age Head: Normocephalic, without obvious abnormality, atraumatic Breasts: normal appearance, no masses or tenderness, Inspection negative, No nipple retraction or dimpling, No nipple discharge or bleeding, No axillary or supraclavicular adenopathy, Normal to palpation without dominant masses Heart: regular rate and rhythm Abdomen: soft, non-tender; bowel sounds normal; no masses,  no organomegaly Extremities: extremities normal, atraumatic, no cyanosis or edema Skin: Skin color, texture, turgor normal. No rashes or lesions Lymph nodes: Cervical, supraclavicular, and axillary nodes normal. No abnormal inguinal nodes palpated Neurologic: Grossly normal   Pelvic: External genitalia:  no lesions              Urethra:  normal appearing urethra with no masses, tenderness or lesions              Bartholins and Skenes: normal                 Vagina: normal appearing vagina with normal color and no discharge, no lesions              Cervix: no bleeding following Pap, no cervical motion tenderness, no lesions, and nulliparous appearance              Pap taken: No. Bimanual Exam:  Uterus:  normal size, contour, position, consistency, mobility, non-tender              Adnexa: no mass, fullness, tenderness              Anus:  normal sphincter tone, no lesions  Chaperone, CMA, was present for exam.  Assessment/Plan:  1. Encounter for annual routine gynecological examination - continue annual screening mammograms  2. Routine screening for STI (sexually transmitted infection) (Primary) - Cervicovaginal ancillary only( [place])  3. Surveillance of  contraceptive pill - norethindrone  (MICRONOR ) 0.35 MG tablet; Take 1 tablet (0.35 mg total) by mouth daily.  Dispense: 84 tablet; Refill: 5  4. Encounter for screening mammogram for malignant neoplasm of breast - MM 3D SCREENING MAMMOGRAM BILATERAL BREAST; Future   RTO 1 year for annual gyn exam. Pt will call her PCP Zigmund Hills FNP) office and notify her of elevated blood pressure. Yolanda Hence

## 2024-05-12 LAB — CERVICOVAGINAL ANCILLARY ONLY
Chlamydia: NEGATIVE
Comment: NEGATIVE
Comment: NEGATIVE
Comment: NORMAL
Neisseria Gonorrhea: NEGATIVE
Trichomonas: NEGATIVE

## 2024-05-13 ENCOUNTER — Ambulatory Visit (HOSPITAL_BASED_OUTPATIENT_CLINIC_OR_DEPARTMENT_OTHER): Payer: Self-pay | Admitting: Certified Nurse Midwife

## 2024-05-26 ENCOUNTER — Encounter (HOSPITAL_BASED_OUTPATIENT_CLINIC_OR_DEPARTMENT_OTHER): Payer: Self-pay | Admitting: Obstetrics & Gynecology

## 2024-10-28 ENCOUNTER — Encounter (HOSPITAL_BASED_OUTPATIENT_CLINIC_OR_DEPARTMENT_OTHER): Payer: Self-pay | Admitting: Obstetrics & Gynecology

## 2024-10-29 ENCOUNTER — Other Ambulatory Visit (HOSPITAL_COMMUNITY)
Admission: RE | Admit: 2024-10-29 | Discharge: 2024-10-29 | Disposition: A | Discharge status: Home / Self Care | Source: Ambulatory Visit | Attending: Obstetrics & Gynecology | Admitting: Obstetrics & Gynecology

## 2024-10-29 ENCOUNTER — Ambulatory Visit (HOSPITAL_BASED_OUTPATIENT_CLINIC_OR_DEPARTMENT_OTHER)

## 2024-10-29 VITALS — BP 125/82 | HR 89 | Wt 199.8 lb

## 2024-10-29 DIAGNOSIS — N898 Other specified noninflammatory disorders of vagina: Secondary | ICD-10-CM

## 2024-10-29 MED ORDER — FLUCONAZOLE 150 MG PO TABS: 150.0000 mg | ORAL_TABLET | Freq: Once | ORAL | 1 refills | Status: AC

## 2024-10-29 NOTE — Progress Notes (Signed)
 NURSE VISIT- VAGINITIS/STD/POC  SUBJECTIVE:  Jamie Chung is a 43 y.o. [id number] GYN patientfemale here for a vaginal swab for vaginitis screening.  She reports the following symptoms: white/yellowish tint for a few days. She denies any odor to the discharge. Denies abnormal vaginal bleeding, significant pelvic pain, fever, or UTI symptoms.  Patient requests that  two doses are sent as she often has to take two doses when she gets a yeast infection.  OBJECTIVE:  Wt 199 lb 12.8 oz (90.6 kg)   BMI 30.60 kg/m   Appears well, in no apparent distress  ASSESSMENT: Vaginal swab for vaginitis screening  PLAN: Self-collected vaginal probe for Gonorrhea, Chlamydia, Trichomonas, Bacterial Vaginosis, and Yeast sent to lab Treatment: to be determined once results are received Follow-up as needed if symptoms persist/worsen, or new symptoms develop.  Patient also notes that she would like to schedule an appointment with provider for bladder leakage.   Morna LOISE Quale, RN

## 2024-10-30 ENCOUNTER — Ambulatory Visit (HOSPITAL_BASED_OUTPATIENT_CLINIC_OR_DEPARTMENT_OTHER): Payer: Self-pay | Admitting: Obstetrics & Gynecology

## 2024-10-30 LAB — CERVICOVAGINAL ANCILLARY ONLY
Bacterial Vaginitis (gardnerella): NEGATIVE
Candida Glabrata: NEGATIVE
Candida Vaginitis: NEGATIVE
Chlamydia: NEGATIVE
Comment: NEGATIVE
Comment: NEGATIVE
Comment: NEGATIVE
Comment: NEGATIVE
Comment: NEGATIVE
Comment: NORMAL
Neisseria Gonorrhea: NEGATIVE
Trichomonas: NEGATIVE

## 2024-11-02 ENCOUNTER — Ambulatory Visit (HOSPITAL_BASED_OUTPATIENT_CLINIC_OR_DEPARTMENT_OTHER)

## 2024-11-02 ENCOUNTER — Encounter (HOSPITAL_BASED_OUTPATIENT_CLINIC_OR_DEPARTMENT_OTHER): Payer: Self-pay

## 2024-11-02 ENCOUNTER — Encounter (HOSPITAL_BASED_OUTPATIENT_CLINIC_OR_DEPARTMENT_OTHER): Payer: Self-pay | Admitting: Obstetrics & Gynecology

## 2024-11-02 VITALS — BP 132/92 | HR 76 | Wt 198.0 lb

## 2024-11-02 DIAGNOSIS — R350 Frequency of micturition: Secondary | ICD-10-CM | POA: Diagnosis not present

## 2024-11-02 LAB — POCT URINALYSIS DIP (CLINITEK)
Bilirubin, UA: NEGATIVE
Blood, UA: NEGATIVE
Glucose, UA: NEGATIVE mg/dL
Ketones, POC UA: NEGATIVE mg/dL
Nitrite, UA: NEGATIVE
Spec Grav, UA: >=1.03 — AB (ref 1.010–1.025)
Urobilinogen, UA: NEGATIVE U/dL — AB
pH, UA: 6 (ref 5.0–8.0)

## 2024-11-02 MED ORDER — NITROFURANTOIN MONOHYD MACRO 100 MG PO CAPS
100.0000 mg | ORAL_CAPSULE | Freq: Two times a day (BID) | ORAL | 0 refills | Status: AC
Start: 2024-11-02 — End: ?

## 2024-11-02 MED ORDER — PHENAZOPYRIDINE HCL 200 MG PO TABS
200.0000 mg | ORAL_TABLET | Freq: Three times a day (TID) | ORAL | 0 refills | Status: AC | PRN
Start: 2024-11-02 — End: ?

## 2024-11-02 NOTE — Progress Notes (Signed)
 NURSE VISIT- UTI SYMPTOMS   SUBJECTIVE:  Jamie Chung is a 43 y.o. G96P0020 female here for UTI symptoms. She is a GYN patient. She reports dysuria and urinary frequency. Patient reports pelvic pressure as well.  Patient reports that symptoms have been occurring for a couple of weeks.   OBJECTIVE:  Appears well, in no apparent distress  No results found for this or any previous visit (from the past 24 hours).  ASSESSMENT: GYN patient with UTI symptoms and negative nitrites. Negative for leukocytes as well.   PLAN: Visit routed to or discussed with:  Rx sent today: Yes Urine culture sent Call or return to clinic prn if these symptoms worsen or fail to improve as anticipated. Follow-up: as needed   Morna LOISE Quale, RN

## 2024-11-06 ENCOUNTER — Ambulatory Visit (HOSPITAL_BASED_OUTPATIENT_CLINIC_OR_DEPARTMENT_OTHER): Payer: Self-pay | Admitting: Certified Nurse Midwife

## 2024-11-06 LAB — URINE CULTURE

## 2024-11-16 ENCOUNTER — Ambulatory Visit (HOSPITAL_BASED_OUTPATIENT_CLINIC_OR_DEPARTMENT_OTHER): Admitting: Certified Nurse Midwife

## 2024-12-22 ENCOUNTER — Other Ambulatory Visit (HOSPITAL_BASED_OUTPATIENT_CLINIC_OR_DEPARTMENT_OTHER): Payer: Self-pay

## 2024-12-22 ENCOUNTER — Other Ambulatory Visit: Payer: Self-pay

## 2024-12-22 ENCOUNTER — Encounter (HOSPITAL_BASED_OUTPATIENT_CLINIC_OR_DEPARTMENT_OTHER): Payer: Self-pay | Admitting: Emergency Medicine

## 2024-12-22 ENCOUNTER — Emergency Department (HOSPITAL_BASED_OUTPATIENT_CLINIC_OR_DEPARTMENT_OTHER)
Admission: EM | Admit: 2024-12-22 | Discharge: 2024-12-22 | Disposition: A | Discharge status: Home / Self Care | Attending: Emergency Medicine | Admitting: Emergency Medicine

## 2024-12-22 DIAGNOSIS — D72829 Elevated white blood cell count, unspecified: Secondary | ICD-10-CM | POA: Insufficient documentation

## 2024-12-22 DIAGNOSIS — Z79899 Other long term (current) drug therapy: Secondary | ICD-10-CM | POA: Insufficient documentation

## 2024-12-22 DIAGNOSIS — R1032 Left lower quadrant pain: Secondary | ICD-10-CM | POA: Insufficient documentation

## 2024-12-22 DIAGNOSIS — R319 Hematuria, unspecified: Secondary | ICD-10-CM | POA: Insufficient documentation

## 2024-12-22 DIAGNOSIS — R32 Unspecified urinary incontinence: Secondary | ICD-10-CM | POA: Diagnosis not present

## 2024-12-22 DIAGNOSIS — M545 Low back pain, unspecified: Secondary | ICD-10-CM | POA: Insufficient documentation

## 2024-12-22 DIAGNOSIS — R748 Abnormal levels of other serum enzymes: Secondary | ICD-10-CM | POA: Insufficient documentation

## 2024-12-22 LAB — CBC WITH DIFFERENTIAL/PLATELET
Abs Immature Granulocytes: 0.01 K/uL (ref 0.00–0.07)
Basophils Absolute: 0 K/uL (ref 0.0–0.1)
Basophils Relative: 0 %
Eosinophils Absolute: 0.1 K/uL (ref 0.0–0.5)
Eosinophils Relative: 3 %
HCT: 37.9 % (ref 36.0–46.0)
Hemoglobin: 13 g/dL (ref 12.0–15.0)
Immature Granulocytes: 0 %
Lymphocytes Relative: 38 %
Lymphs Abs: 1.8 K/uL (ref 0.7–4.0)
MCH: 30.6 pg (ref 26.0–34.0)
MCHC: 34.3 g/dL (ref 30.0–36.0)
MCV: 89.2 fL (ref 80.0–100.0)
Monocytes Absolute: 0.5 K/uL (ref 0.1–1.0)
Monocytes Relative: 12 %
Neutro Abs: 2.1 K/uL (ref 1.7–7.7)
Neutrophils Relative %: 47 %
Platelets: 267 K/uL (ref 150–400)
RBC: 4.25 MIL/uL (ref 3.87–5.11)
RDW: 15.1 % (ref 11.5–15.5)
WBC: 4.6 K/uL (ref 4.0–10.5)
nRBC: 0 % (ref 0.0–0.2)

## 2024-12-22 LAB — URINALYSIS, ROUTINE W REFLEX MICROSCOPIC
Bilirubin Urine: NEGATIVE
Glucose, UA: NEGATIVE mg/dL
Hgb urine dipstick: NEGATIVE
Ketones, ur: NEGATIVE mg/dL
Leukocytes,Ua: NEGATIVE
Nitrite: NEGATIVE
Protein, ur: NEGATIVE mg/dL
Specific Gravity, Urine: 1.01 (ref 1.005–1.030)
pH: 7 (ref 5.0–8.0)

## 2024-12-22 LAB — LIPASE, BLOOD: Lipase: 30 U/L (ref 11–51)

## 2024-12-22 LAB — COMPREHENSIVE METABOLIC PANEL WITH GFR
ALT: 17 U/L (ref 0–44)
AST: 49 U/L — ABNORMAL HIGH (ref 15–41)
Albumin: 3.7 g/dL (ref 3.5–5.0)
Alkaline Phosphatase: 74 U/L (ref 38–126)
Anion gap: 8 (ref 5–15)
BUN: 10 mg/dL (ref 6–20)
CO2: 25 mmol/L (ref 22–32)
Calcium: 8.7 mg/dL — ABNORMAL LOW (ref 8.9–10.3)
Chloride: 104 mmol/L (ref 98–111)
Creatinine, Ser: 0.82 mg/dL (ref 0.44–1.00)
GFR, Estimated: >60 mL/min
Glucose, Bld: 85 mg/dL (ref 70–99)
Potassium: 5.1 mmol/L (ref 3.5–5.1)
Sodium: 137 mmol/L (ref 135–145)
Total Bilirubin: 0.3 mg/dL (ref 0.0–1.2)
Total Protein: 6.7 g/dL (ref 6.5–8.1)

## 2024-12-22 LAB — CK: Total CK: 308 U/L — ABNORMAL HIGH (ref 38–234)

## 2024-12-22 LAB — PREGNANCY, URINE: Preg Test, Ur: NEGATIVE

## 2024-12-22 MED ORDER — ONDANSETRON HCL 4 MG PO TABS
4.0000 mg | ORAL_TABLET | Freq: Four times a day (QID) | ORAL | 0 refills | Status: AC
  Filled 2024-12-22: qty 12, 3d supply, fill #0

## 2024-12-22 MED ORDER — ACETAMINOPHEN 325 MG PO TABS: 650.0000 mg | ORAL_TABLET | ORAL | Status: DC | PRN

## 2024-12-22 MED ORDER — ONDANSETRON HCL 4 MG/2ML IJ SOLN
4.0000 mg | Freq: Once | INTRAMUSCULAR | Status: AC
  Administered 2024-12-22: 4 mg via INTRAVENOUS
  Filled 2024-12-22: qty 2

## 2024-12-22 MED ORDER — SODIUM CHLORIDE 0.9 % IV BOLUS
1000.0000 mL | Freq: Once | INTRAVENOUS | Status: AC
  Administered 2024-12-22: 1000 mL via INTRAVENOUS

## 2024-12-22 NOTE — ED Provider Notes (Signed)
 " [place] EMERGENCY DEPARTMENT AT [facility] Provider Note   CSN: [id number] Arrival date & time: 12/22/24  9059     Patient presents with: Urinary Incontinence and Hematuria   Jamie Chung is a 44 y.o. female.   Patient presents with nausea/vomiting, urinary incontinence, new hematuria, cramping/back pain.  Patient additionally requesting check and CK level based on history of limited CK.  Many of the symptoms per patient are been chronic.    -Nausea/vomiting: Nausea has been occurring since Christmas.  Patient does have a history of gastric sleeve from May 2024.  Last episode of vomiting was 1/5.  She believes that it could be associated with her abdominal pain. -Urinary incontinence: She reports back in May 2024 she had gastric sleeve surgery and several months after that urinary incontinence/leakage was noted.  She figured things would get better, however things have worsened especially over the past couple weeks.  Urinary incontinence occurs daily. The urinary incontinence occurs when just standing in the kitchen or with things like sneezing, laughing, coughing.  She denies dysuria. States that she feels like she empties her bladder fully when she does use the restroom. -Hematuria: Noticed light red thin consistency blood on the toilet tissue after wiping that she believes is from her urine.  Her last menstrual cycle was 12/19 she is on birth control.  She is not expecting her menstrual cycle within the next couple of days.  She does not believe that it is coming from her rectum. -Cramping/back pain: Patient has a history of cyclical.  Cramping, but after her myomectomy in 2022 it had resolved.  She has not been having lower abdominal/pelvic cramping off/on that is not associated with her period.  She says that it will hurt for hours then she will take ibuprofen  and it will go away.  She will be days without pain. - Elevated CK: Patient denied any recent strenuous  exercise, seizure, syncope.    Hematuria       Prior to Admission medications  Medication Sig Start Date End Date Taking? Authorizing Provider  albuterol  (VENTOLIN  HFA) 108 (90 Base) MCG/ACT inhaler Inhale 2 puffs into the lungs every 4 (four) hours as needed for wheezing or shortness of breath. Patient not taking: Reported on 05/08/2024 11/08/19   Hall-Potvin, Brittany, PA-C  buPROPion (WELLBUTRIN XL) 150 MG 24 hr tablet Take 150 mg by mouth at bedtime. Patient not taking: Reported on 05/08/2024 09/01/20   [provider]  LORazepam  (ATIVAN ) 1 MG tablet Take 1 tablet one hour prior to procedure and bring remaining tablets with you to the procedure appointment Patient not taking: Reported on 05/08/2024 08/29/21   Cleotilde Ronal RAMAN, MD  nitrofurantoin , macrocrystal-monohydrate, (MACROBID ) 100 MG capsule Take 1 capsule (100 mg total) by mouth 2 (two) times daily. 11/02/24   Lo, Arland POUR, CNM  norethindrone  (MICRONOR ) 0.35 MG tablet Take 1 tablet (0.35 mg total) by mouth daily. 05/08/24   Tad Arland POUR, CNM  norethindrone -ethinyl estradiol-FE (BLISOVI FE 1/20) 1-20 MG-MCG tablet Take 1 tablet by mouth daily. 05/05/24   Cleotilde Ronal RAMAN, MD  omeprazole (PRILOSEC) 20 MG capsule Take 20 mg by mouth daily. 04/08/23 05/08/24  [provider]  ondansetron  (ZOFRAN -ODT) 4 MG disintegrating tablet Take 1 tablet (4 mg total) by mouth every 8 (eight) hours as needed. Patient not taking: Reported on 05/08/2024 07/21/23   Patsey Lot, MD  phenazopyridine  (PYRIDIUM ) 200 MG tablet Take 1 tablet (200 mg total) by mouth 3 (three) times daily as  needed for pain (urethral spasm). 11/02/24   Lo, Donna K, CNM  traZODone (DESYREL) 100 MG tablet Take by mouth. Patient not taking: Reported on 05/08/2024 05/31/20   [provider]  ursodiol (ACTIGALL) 250 MG tablet Take 250 mg by mouth 2 (two) times daily. Patient not taking: Reported on 05/08/2024 04/08/23   [provider]    Allergies:  Lisinopril and Mobic [meloxicam]    Review of Systems  Genitourinary:  Positive for hematuria.    Updated Vital Signs BP 134/82 (BP Location: Left Arm)   Pulse 86   Temp 98.7 F (37.1 C)   Resp 16   Wt 88.5 kg   LMP 12/04/2024   SpO2 98%   BMI 29.87 kg/m   Physical Exam  (all labs ordered are listed, but only abnormal results are displayed) Labs Reviewed  CBC WITH DIFFERENTIAL/PLATELET  URINALYSIS, ROUTINE W REFLEX MICROSCOPIC  PREGNANCY, URINE  CK  COMPREHENSIVE METABOLIC PANEL WITH GFR  LIPASE, BLOOD    EKG: None  Radiology: No results found.   Procedures   Medications Ordered in the ED  acetaminophen  (TYLENOL ) tablet 650 mg (has no administration in time range)  sodium chloride  0.9 % bolus 1,000 mL (1,000 mLs Intravenous New Bag/Given 12/22/24 1130)  ondansetron  (ZOFRAN ) injection 4 mg (4 mg Intravenous Given 12/22/24 1130)                                    Medical Decision Making This patient is a 45 y.o. female who presents to the ED for concern of nausea/vomiting, urinary incontinence, new hematuria, cramping/back pain, this involves an extensive number of treatment options, and is a complaint that carries with it a high risk of complications and morbidity. The emergent differential diagnosis prior to evaluation includes, but is not limited to, infection, rhabdomyolysis, UTI, pyelonephritis. This is not an exhaustive differential.   Past Medical History / Co-morbidities / Social History: PCOS  Additional history: Chart reviewed. Pertinent results include:  Patient was seen in the ED 07/2023 for nausea and vomiting sent from urgent care for concentrated urine and concern for rhabdo.  Complained of mild abdominal cramping -CK was repeated and normal.  Blood work was overall reassuring.  Urine did not show infection.  Was discharged with Zofran .  Abdominal exam was benign.  Physical Exam: Physical exam performed. The pertinent findings include:  Physical  exam was remarkable for right lower quadrant tenderness without rebound or guarding.  Negative Rovsing.  No peritoneal signs.  Lab Tests: I ordered, and personally interpreted labs. The pertinent results include:  - Lipase: 30 - CMP: Unremarkable.  Creatinine 0.82 - Total CK 308 - Urinalysis: negative - Pregnancy test: negative  Imaging Studies: Deferred at this time.  EKG/Cardiac Monitoring: Deferred at this time  Medications: I ordered medication including -Zofran  4 mg -Sodium chloride  0.9% bolus 1 L -Tylenol  650 mg as needed  MDM: Patient presented with concern of nausea/vomiting, crampy abdominal pain, urinary incontinence, and hematuria.  Presented similarly 07/2023 workup was unremarkable.  Physical exam today positive for right lower quadrant tenderness without peritoneal signs.  CBC unremarkable for leukocytosis.  Patient remained afebrile.  Low suspicion for infection at this time causing normal/vomiting.  Nausea/vomiting possibly 2/2 abdominal pain.  Low suspicion for nephrolithiasis at this time given negative for hematuria on UA and unremarkable CBC and CMP.  CK unremarkable at 308.  Patient had also no recent strenuous exercise,  no seizure no syncope.  Creatinine was 0.82.  Patient received fluids.  Unfortunately was unable to discover cause at this time for urinary incontinence, encourage patient to follow-up outpatient for further workup.  No infectious cause suspected.  Return precautions discussed.  Zofran  given for discharge and encourage patient to take Tylenol  or ibuprofen  for pain as needed.  I discussed this case with my attending physician Dr. Pamella who cosigned this note including patient's presenting symptoms, physical exam, and planned diagnostics and interventions. Attending physician stated agreement with plan or made changes to plan which were implemented.      Amount and/or Complexity of Data Reviewed Labs: ordered.  Risk OTC drugs. Prescription drug  management.       Final diagnoses:  None    ED Discharge Orders     None          Brynnleigh Mcelwee, DO 12/22/24 1416  "

## 2024-12-22 NOTE — ED Notes (Signed)
 Pt given water

## 2024-12-22 NOTE — Discharge Instructions (Addendum)
 Your workup was negative.  There is no blood, bacteria, or inflammatory markers found in your urine. Your CK was elevated at 308, however this is generally found unremarkable especially in combination with your kidney numbers not showing dehydration and your urinalysis shows that you are adequately filtrating.   Other labs that we looked into regarding your symptoms included a number for your pancreas which was unremarkable.  Your white blood cell count was normal and without a fever, it is unlikely that you are fighting an infection.  Your electrolytes were also within normal limits.  At this time I am sorry to say that we are not sure why you are having urinary incontinence.  I recommend that you follow-up outpatient with your primary care doctor and ask for referral to urology.  Regarding her nausea and vomiting, we will send you home with some Zofran .  Please return back to the emergency department if you have any worsening of your symptoms, intractable nausea and vomiting that is unrelieved with Zofran , increased amount of blood in your urine, large amounts of blood in your stool, chest pain, or shortness of breath.  Take Tylenol  as needed for your symptoms.

## 2024-12-22 NOTE — ED Notes (Signed)
 ED Provider at bedside.

## 2024-12-22 NOTE — ED Triage Notes (Signed)
 Pt endorse n/v last night, and urinary incontinence with hematuria and lower back pain for awhile that has worsened for last few weeks. Requesting CK level check
